# Patient Record
Sex: Female | Born: 1985
Health system: Southern US, Community
[De-identification: ages and names within clinical notes are randomized; demographics above are authoritative.]

## PROBLEM LIST (undated history)

## (undated) DIAGNOSIS — J45909 Unspecified asthma, uncomplicated: Secondary | ICD-10-CM

## (undated) DIAGNOSIS — F419 Anxiety disorder, unspecified: Secondary | ICD-10-CM

## (undated) DIAGNOSIS — N289 Disorder of kidney and ureter, unspecified: Secondary | ICD-10-CM

## (undated) DIAGNOSIS — F329 Major depressive disorder, single episode, unspecified: Secondary | ICD-10-CM

## (undated) DIAGNOSIS — T7840XA Allergy, unspecified, initial encounter: Secondary | ICD-10-CM

## (undated) DIAGNOSIS — Z87442 Personal history of urinary calculi: Secondary | ICD-10-CM

## (undated) DIAGNOSIS — F32A Depression, unspecified: Secondary | ICD-10-CM

## (undated) DIAGNOSIS — K529 Noninfective gastroenteritis and colitis, unspecified: Secondary | ICD-10-CM

## (undated) HISTORY — DX: Unspecified asthma, uncomplicated: J45.909

## (undated) HISTORY — DX: Noninfective gastroenteritis and colitis, unspecified: K52.9

## (undated) HISTORY — DX: Anxiety disorder, unspecified: F41.9

## (undated) HISTORY — DX: Allergy, unspecified, initial encounter: T78.40XA

## (undated) HISTORY — DX: Personal history of urinary calculi: Z87.442

## (undated) HISTORY — DX: Depression, unspecified: F32.A

## (undated) HISTORY — DX: Major depressive disorder, single episode, unspecified: F32.9

---

## 2008-08-14 HISTORY — PX: COLONOSCOPY: SHX174

## 2014-02-11 ENCOUNTER — Encounter: Payer: Self-pay | Admitting: Adult Health

## 2014-02-11 ENCOUNTER — Encounter (INDEPENDENT_AMBULATORY_CARE_PROVIDER_SITE_OTHER): Payer: Self-pay

## 2014-02-11 ENCOUNTER — Ambulatory Visit (INDEPENDENT_AMBULATORY_CARE_PROVIDER_SITE_OTHER): Payer: Federal, State, Local not specified - PPO | Admitting: Adult Health

## 2014-02-11 VITALS — BP 104/68 | HR 70 | Temp 98.8°F | Resp 14 | Ht 62.0 in | Wt 182.5 lb

## 2014-02-11 DIAGNOSIS — Z23 Encounter for immunization: Secondary | ICD-10-CM

## 2014-02-11 DIAGNOSIS — Z3009 Encounter for other general counseling and advice on contraception: Secondary | ICD-10-CM

## 2014-02-11 DIAGNOSIS — Z0000 Encounter for general adult medical examination without abnormal findings: Secondary | ICD-10-CM

## 2014-02-11 DIAGNOSIS — J4530 Mild persistent asthma, uncomplicated: Secondary | ICD-10-CM

## 2014-02-11 DIAGNOSIS — J45909 Unspecified asthma, uncomplicated: Secondary | ICD-10-CM

## 2014-02-11 MED ORDER — LEVONORGEST-ETH ESTRAD 91-DAY 0.15-0.03 &0.01 MG PO TABS: 1.0000 | ORAL_TABLET | Freq: Every day | ORAL | Status: DC

## 2014-02-11 MED ORDER — EPINEPHRINE 0.3 MG/0.3ML IJ SOAJ: 0.3000 mg | INTRAMUSCULAR | Status: DC | PRN

## 2014-02-11 MED ORDER — ALBUTEROL SULFATE HFA 108 (90 BASE) MCG/ACT IN AERS: 2.0000 | INHALATION_SPRAY | Freq: Four times a day (QID) | RESPIRATORY_TRACT | Status: AC | PRN

## 2014-02-11 NOTE — Progress Notes (Signed)
Pre visit review using our clinic review tool, if applicable. No additional management support is needed unless otherwise documented below in the visit note. 

## 2014-02-11 NOTE — Patient Instructions (Addendum)
  Start Seasonique birth control pills on the Sunday after your period. You will take 1 pill daily for 12 weeks straight. Then no pills on week #13. You may experience breakthrough bleeding for the first 2 cycles but this should subside by the 3 cycle.  Please return for your fasting blood work. You will need an appointment.  Medication refills sent to your pharmacy.  You received your tetanus vaccine today. This is good for 10 years.  Please call with any questions or concerns.

## 2014-02-11 NOTE — Progress Notes (Signed)
Subjective:    Patient ID: Brooke Walsh, female    DOB: Jun 19, 1986, 28 y.o.   MRN: [id number]  HPI  28 yo female presents today to establish care. [place]. Has seasonal allergies and asthma. Is needing refills of her albuterol and epi-pens. Uses the epi-pen for bee allergy.  Currently smokes 6 cigarettes/day. Previously attempted to quit smoking by taking Wellbutrin. Once stopped Wellbutrin, began smoking again.   Works for the [employer] and currently working on entering a nursing program.   Prevention:  PAP - 02/11/14 @ [facility] Tdap - needed Guardsil - completed Colonoscopy - 2011; Paternal grandmother had colon cancer  Past Medical History  Diagnosis Date  . Asthma   . Depression   . Allergy   . History of kidney stones    Family History  Problem Relation Age of Onset  . Alcohol abuse Mother   . Hyperlipidemia Mother   . Heart disease Mother   . Stroke Mother   . Hypertension Mother   . Hyperlipidemia Maternal Grandmother   . Heart disease Maternal Grandmother   . Hypertension Maternal Grandmother   . Diabetes Maternal Grandmother   . Alcohol abuse Maternal Grandfather   . Kidney disease Maternal Grandfather   . Cancer Paternal Grandmother     colon   Current outpatient prescriptions:Cyclobenzaprine HCl (FLEXERIL PO), Take 1 tablet by mouth as needed., Disp: , Rfl: ;  drospirenone-ethinyl estradiol (YAZ,GIANVI,LORYNA) 3-0.02 MG tablet, Take 1 tablet by mouth daily., Disp: , Rfl: ;  EPINEPHrine 0.3 mg/0.3 mL IJ SOAJ injection, Inject 0.3 mg into the muscle as needed., Disp: , Rfl: ;  UNABLE TO FIND, Inhaler, Disp: , Rfl:   Review of Systems  Constitutional: Negative.   HENT: Negative.   Eyes: Negative.   Respiratory: Negative.   Cardiovascular: Negative.   Gastrointestinal: Negative.   Endocrine: Negative.   Genitourinary: Negative.   Musculoskeletal: Negative.   Allergic/Immunologic: Positive for  environmental allergies.  Neurological: Negative.   Hematological: Negative.   Psychiatric/Behavioral: Negative.       Objective:  BP 104/68  Pulse 70  Temp(Src) 98.8 F (37.1 C) (Oral)  Resp 14  Ht 5\' 2"  (1.575 m)  Wt 182 lb 8 oz (82.781 kg)  BMI 33.37 kg/m2  SpO2 96%  LMP 01/28/2014   Physical Exam  Constitutional: She is oriented to person, place, and time. No distress.  HENT:  Head: Normocephalic.  Cardiovascular: Normal rate, regular rhythm and normal heart sounds.   Pulmonary/Chest: Effort normal and breath sounds normal.  Neurological: She is alert and oriented to person, place, and time.  Skin: Skin is warm and dry.  Psychiatric: She has a normal mood and affect. Her behavior is normal. Judgment and thought content normal.       Assessment & Plan:   1. Need for Tdap vaccination Does not recall last tetanus vaccination. Pt agreed to vaccinate today.  - Tdap vaccine greater than or equal to 7yo IM  2. Laboratory examination ordered as part of a complete physical examination Unknown last set of laboratory results. Pt is not fasting today. Will order labs for patient to obtain when fasting.  - TSH; Future - CBC with Differential; Future - Lipid panel; Future - Vit D  25 hydroxy (rtn osteoporosis monitoring); Future - Vitamin B12; Future - Basic metabolic panel; Future  3. Asthma, chronic, mild persistent, uncomplicated Self-reported asthma. Indicates she uses her albuterol inhaler at least once per day.  Consider adding steroid inhaler if remains uncontrolled.   - albuterol (PROVENTIL HFA;VENTOLIN HFA) 108 (90 BASE) MCG/ACT inhaler; Inhale 2 puffs into the lungs every 6 (six) hours as needed for wheezing or shortness of breath.  Dispense: 1 Inhaler; Refill: 11  4. Birth control counseling Discussed birth control options. Pt in agreement with trying longer cycles. Start Seasonique birth control pills on the Sunday after your period. Instructed to take 1 pill  daily for 12 weeks straight. Then no pills on week #13. If experience breakthrough bleeding for the first 2 cycles but this should subside by the 3 cycle.  - Levonorgestrel-Ethinyl Estradiol (AMETHIA,CAMRESE) 0.15-0.03 &0.01 MG tablet; Take 1 tablet by mouth daily.  Dispense: 1 Package; Refill: 4

## 2014-02-16 ENCOUNTER — Other Ambulatory Visit (INDEPENDENT_AMBULATORY_CARE_PROVIDER_SITE_OTHER): Payer: Federal, State, Local not specified - PPO

## 2014-02-16 DIAGNOSIS — Z0000 Encounter for general adult medical examination without abnormal findings: Secondary | ICD-10-CM

## 2014-02-16 LAB — VITAMIN B12: VITAMIN B 12: 419 pg/mL (ref 211–911)

## 2014-02-16 LAB — CBC WITH DIFFERENTIAL/PLATELET
BASOS PCT: 0.6 % (ref 0.0–3.0)
Basophils Absolute: 0.1 10*3/uL (ref 0.0–0.1)
Eosinophils Absolute: 0.1 10*3/uL (ref 0.0–0.7)
Eosinophils Relative: 1.1 % (ref 0.0–5.0)
HEMATOCRIT: 41.4 % (ref 36.0–46.0)
HEMOGLOBIN: 13.9 g/dL (ref 12.0–15.0)
LYMPHS ABS: 3.2 10*3/uL (ref 0.7–4.0)
Lymphocytes Relative: 26.9 % (ref 12.0–46.0)
MCHC: 33.5 g/dL (ref 30.0–36.0)
MCV: 93.9 fl (ref 78.0–100.0)
MONO ABS: 0.5 10*3/uL (ref 0.1–1.0)
MONOS PCT: 4.6 % (ref 3.0–12.0)
NEUTROS ABS: 7.9 10*3/uL — AB (ref 1.4–7.7)
Neutrophils Relative %: 66.8 % (ref 43.0–77.0)
PLATELETS: 318 10*3/uL (ref 150.0–400.0)
RBC: 4.41 Mil/uL (ref 3.87–5.11)
RDW: 14.4 % (ref 11.5–15.5)
WBC: 11.9 10*3/uL — AB (ref 4.0–10.5)

## 2014-02-16 LAB — LIPID PANEL
CHOL/HDL RATIO: 3
Cholesterol: 179 mg/dL (ref 0–200)
HDL: 59.1 mg/dL (ref >39.00)
LDL Cholesterol: 97 mg/dL (ref 0–99)
NONHDL: 119.9
Triglycerides: 114 mg/dL (ref 0.0–149.0)
VLDL: 22.8 mg/dL (ref 0.0–40.0)

## 2014-02-16 LAB — BASIC METABOLIC PANEL
BUN: 12 mg/dL (ref 6–23)
CALCIUM: 9.2 mg/dL (ref 8.4–10.5)
CO2: 21 mEq/L (ref 19–32)
CREATININE: 0.9 mg/dL (ref 0.4–1.2)
Chloride: 109 mEq/L (ref 96–112)
GFR: 94.94 mL/min (ref >60.00)
Glucose, Bld: 95 mg/dL (ref 70–99)
Potassium: 4 mEq/L (ref 3.5–5.1)
Sodium: 139 mEq/L (ref 135–145)

## 2014-02-16 LAB — TSH: TSH: 1.23 u[IU]/mL (ref 0.35–4.50)

## 2014-02-16 LAB — VITAMIN D 25 HYDROXY (VIT D DEFICIENCY, FRACTURES): VITD: 11.2 ng/mL

## 2014-06-19 ENCOUNTER — Telehealth: Payer: Self-pay | Admitting: Adult Health

## 2014-06-19 NOTE — Telephone Encounter (Signed)
Spoke with pt, transferred to scheduling for appoint at [facility] next week

## 2014-06-19 NOTE — Telephone Encounter (Signed)
[name] called in regards to having a re-occuring cyst to come and go in her lower pelvic region. She said it's not in her pubic area. There's no pain or discharge but it comes and goes and this has been going on for years. She also mentioned it's the size of two quarters. The pt is wondering if she can be seen soon in regards to this. Please call the patient. Pt ph# [phone] Thank you.

## 2014-06-22 ENCOUNTER — Ambulatory Visit (INDEPENDENT_AMBULATORY_CARE_PROVIDER_SITE_OTHER): Payer: Federal, State, Local not specified - PPO | Admitting: Internal Medicine

## 2014-06-22 ENCOUNTER — Encounter: Payer: Self-pay | Admitting: Internal Medicine

## 2014-06-22 VITALS — BP 120/78 | HR 99 | Temp 98.1°F | Wt 175.5 lb

## 2014-06-22 DIAGNOSIS — N764 Abscess of vulva: Secondary | ICD-10-CM

## 2014-06-22 DIAGNOSIS — F489 Nonpsychotic mental disorder, unspecified: Secondary | ICD-10-CM

## 2014-06-22 DIAGNOSIS — F4323 Adjustment disorder with mixed anxiety and depressed mood: Secondary | ICD-10-CM

## 2014-06-22 DIAGNOSIS — Z7289 Other problems related to lifestyle: Secondary | ICD-10-CM

## 2014-06-22 MED ORDER — DOXYCYCLINE HYCLATE 100 MG PO TABS: 100.0000 mg | ORAL_TABLET | Freq: Two times a day (BID) | ORAL | Status: DC

## 2014-06-22 MED ORDER — BUSPIRONE HCL 10 MG PO TABS: 10.0000 mg | ORAL_TABLET | Freq: Two times a day (BID) | ORAL | Status: DC

## 2014-06-22 NOTE — Progress Notes (Signed)
Pre visit review using our clinic review tool, if applicable. No additional management support is needed unless otherwise documented below in the visit note. 
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
## 2014-06-22 NOTE — Patient Instructions (Signed)
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
## 2014-06-23 ENCOUNTER — Telehealth: Payer: Self-pay | Admitting: Adult Health

## 2014-06-23 NOTE — Telephone Encounter (Signed)
emmi emailed °

## 2014-07-01 ENCOUNTER — Other Ambulatory Visit: Payer: Self-pay | Admitting: *Deleted

## 2014-07-01 ENCOUNTER — Telehealth: Payer: Self-pay | Admitting: *Deleted

## 2014-07-01 MED ORDER — BUDESONIDE-FORMOTEROL FUMARATE 160-4.5 MCG/ACT IN AERO: 2.0000 | INHALATION_SPRAY | Freq: Two times a day (BID) | RESPIRATORY_TRACT | Status: DC

## 2014-07-01 NOTE — Telephone Encounter (Signed)
Please add to med list.

## 2014-07-01 NOTE — Telephone Encounter (Addendum)
Pt called states she was told to call back with the name of the inhaler which is Symbicort.  Pt was seen by you on 11.9.15. Medication not on pts current med list.  Please advise  Medication added, is it ok to refil?

## 2014-07-01 NOTE — Telephone Encounter (Signed)
Yes, ok to refill 

## 2014-07-02 ENCOUNTER — Encounter: Payer: Self-pay | Admitting: Internal Medicine

## 2014-07-02 ENCOUNTER — Ambulatory Visit (INDEPENDENT_AMBULATORY_CARE_PROVIDER_SITE_OTHER): Payer: Federal, State, Local not specified - PPO | Admitting: Internal Medicine

## 2014-07-02 VITALS — BP 108/66 | HR 92 | Temp 98.6°F | Wt 172.0 lb

## 2014-07-02 DIAGNOSIS — J069 Acute upper respiratory infection, unspecified: Secondary | ICD-10-CM

## 2014-07-02 DIAGNOSIS — B9789 Other viral agents as the cause of diseases classified elsewhere: Principal | ICD-10-CM

## 2014-07-02 MED ORDER — HYDROCODONE-HOMATROPINE 5-1.5 MG/5ML PO SYRP: 5.0000 mL | ORAL_SOLUTION | Freq: Three times a day (TID) | ORAL | Status: DC | PRN
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
## 2014-07-02 NOTE — Patient Instructions (Signed)
Cough, Adult  A cough is a reflex that helps clear your throat and airways. It can help heal the body or may be a reaction to an irritated airway. A cough may only last 2 or 3 weeks (acute) or may last more than 8 weeks (chronic).  CAUSES Acute cough:  Viral or bacterial infections. Chronic cough:  Infections.  Allergies.  Asthma.  Post-nasal drip.  Smoking.  Heartburn or acid reflux.  Some medicines.  Chronic lung problems (COPD).  Cancer. SYMPTOMS   Cough.  Fever.  Chest pain.  Increased breathing rate.  High-pitched whistling sound when breathing (wheezing).  Colored mucus that you cough up (sputum). TREATMENT   A bacterial cough may be treated with antibiotic medicine.  A viral cough must run its course and will not respond to antibiotics.  Your caregiver may recommend other treatments if you have a chronic cough. HOME CARE INSTRUCTIONS   Only take over-the-counter or prescription medicines for pain, discomfort, or fever as directed by your caregiver. Use cough suppressants only as directed by your caregiver.  Use a cold steam vaporizer or humidifier in your bedroom or home to help loosen secretions.  Sleep in a semi-upright position if your cough is worse at night.  Rest as needed.  Stop smoking if you smoke. SEEK IMMEDIATE MEDICAL CARE IF:   You have pus in your sputum.  Your cough starts to worsen.  You cannot control your cough with suppressants and are losing sleep.  You begin coughing up blood.  You have difficulty breathing.  You develop pain which is getting worse or is uncontrolled with medicine.  You have a fever. MAKE SURE YOU:   Understand these instructions.  Will watch your condition.  Will get help right away if you are not doing well or get worse. Document Released: 01/27/2011 Document Revised: 10/23/2011 Document Reviewed: 01/27/2011 ExitCare Patient Information 2015 [employer]. This information is not intended  to replace advice given to you by your health care provider. Make sure you discuss any questions you have with your health care provider.  

## 2014-07-02 NOTE — Progress Notes (Signed)
Pre visit review using our clinic review tool, if applicable. No additional management support is needed unless otherwise documented below in the visit note. 

## 2014-07-03 ENCOUNTER — Encounter: Payer: Self-pay | Admitting: Internal Medicine

## 2014-07-07 ENCOUNTER — Other Ambulatory Visit: Payer: Self-pay | Admitting: Internal Medicine

## 2014-07-07 ENCOUNTER — Telehealth: Payer: Self-pay | Admitting: Internal Medicine

## 2014-07-07 MED ORDER — FLUCONAZOLE 150 MG PO TABS: 150.0000 mg | ORAL_TABLET | Freq: Once | ORAL | Status: DC

## 2014-07-07 MED ORDER — AZITHROMYCIN 250 MG PO TABS: ORAL_TABLET | ORAL | Status: DC

## 2014-07-07 NOTE — Telephone Encounter (Signed)
Called in azithromycin for URI symptoms Doxycycline side effects include nausea, GI upset and rash

## 2014-07-07 NOTE — Telephone Encounter (Signed)
Pt called stating she is not feeling any better. Pt request abx called in to [facility] [address]  Pt also request call back regarding side effect of previous abx. Please advise

## 2014-07-07 NOTE — Telephone Encounter (Signed)
Pt called back. States that the previous antibiotic gave her a yeast infection. Can something be call in for yeast infection. Please advise

## 2014-07-07 NOTE — Telephone Encounter (Signed)
Left detailed msg on VM per HIPAA  

## 2014-07-07 NOTE — Telephone Encounter (Signed)
Diflucan called in.

## 2014-07-20 ENCOUNTER — Encounter: Payer: Federal, State, Local not specified - PPO | Admitting: Internal Medicine

## 2014-07-20 ENCOUNTER — Encounter: Payer: Self-pay | Admitting: Internal Medicine

## 2014-07-20 NOTE — Progress Notes (Signed)
Pre visit review using our clinic review tool, if applicable. No additional management support is needed unless otherwise documented below in the visit note. 

## 2014-07-21 NOTE — Progress Notes (Signed)
error 

## 2014-07-23 ENCOUNTER — Encounter (INDEPENDENT_AMBULATORY_CARE_PROVIDER_SITE_OTHER): Payer: Self-pay

## 2014-07-23 ENCOUNTER — Encounter (HOSPITAL_COMMUNITY): Payer: Self-pay | Admitting: Psychiatry

## 2014-07-23 ENCOUNTER — Ambulatory Visit (INDEPENDENT_AMBULATORY_CARE_PROVIDER_SITE_OTHER): Payer: Federal, State, Local not specified - PPO | Admitting: Psychiatry

## 2014-07-23 VITALS — BP 127/78 | HR 60 | Ht 62.0 in | Wt 175.0 lb

## 2014-07-23 DIAGNOSIS — Z7289 Other problems related to lifestyle: Secondary | ICD-10-CM

## 2014-07-23 DIAGNOSIS — F411 Generalized anxiety disorder: Secondary | ICD-10-CM | POA: Insufficient documentation

## 2014-07-23 DIAGNOSIS — F3181 Bipolar II disorder: Secondary | ICD-10-CM

## 2014-07-23 DIAGNOSIS — F17210 Nicotine dependence, cigarettes, uncomplicated: Secondary | ICD-10-CM

## 2014-07-23 DIAGNOSIS — F1099 Alcohol use, unspecified with unspecified alcohol-induced disorder: Secondary | ICD-10-CM

## 2014-07-23 DIAGNOSIS — F4323 Adjustment disorder with mixed anxiety and depressed mood: Secondary | ICD-10-CM

## 2014-07-23 DIAGNOSIS — F1210 Cannabis abuse, uncomplicated: Secondary | ICD-10-CM

## 2014-07-23 DIAGNOSIS — F410 Panic disorder [episodic paroxysmal anxiety] without agoraphobia: Secondary | ICD-10-CM

## 2014-07-23 DIAGNOSIS — F1020 Alcohol dependence, uncomplicated: Secondary | ICD-10-CM

## 2014-07-23 MED ORDER — LITHIUM CARBONATE ER 300 MG PO TBCR: 300.0000 mg | EXTENDED_RELEASE_TABLET | Freq: Every day | ORAL | Status: DC

## 2014-07-23 MED ORDER — BUSPIRONE HCL 10 MG PO TABS: ORAL_TABLET | ORAL | Status: DC
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
## 2014-07-29 ENCOUNTER — Ambulatory Visit (INDEPENDENT_AMBULATORY_CARE_PROVIDER_SITE_OTHER): Payer: Federal, State, Local not specified - PPO | Admitting: Clinical

## 2014-07-29 ENCOUNTER — Encounter (HOSPITAL_COMMUNITY): Payer: Self-pay | Admitting: Clinical

## 2014-07-29 DIAGNOSIS — F331 Major depressive disorder, recurrent, moderate: Secondary | ICD-10-CM

## 2014-07-29 DIAGNOSIS — F411 Generalized anxiety disorder: Secondary | ICD-10-CM

## 2014-07-29 DIAGNOSIS — F1994 Other psychoactive substance use, unspecified with psychoactive substance-induced mood disorder: Secondary | ICD-10-CM

## 2014-07-29 DIAGNOSIS — F10220 Alcohol dependence with intoxication, uncomplicated: Secondary | ICD-10-CM
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
## 2014-07-31 ENCOUNTER — Telehealth (HOSPITAL_COMMUNITY): Payer: Self-pay | Admitting: *Deleted

## 2014-07-31 NOTE — Telephone Encounter (Signed)
Pt called stating she had a medication question, no further information given. Returned call, no answer.

## 2014-08-11 ENCOUNTER — Telehealth (HOSPITAL_COMMUNITY): Payer: Self-pay

## 2014-08-11 NOTE — Telephone Encounter (Signed)
Called and left voice message for call back 

## 2014-08-13 ENCOUNTER — Ambulatory Visit (HOSPITAL_COMMUNITY): Payer: Self-pay | Admitting: Clinical

## 2014-08-13 ENCOUNTER — Telehealth (HOSPITAL_COMMUNITY): Payer: Self-pay | Admitting: Psychiatry

## 2014-08-13 DIAGNOSIS — F3181 Bipolar II disorder: Secondary | ICD-10-CM

## 2014-08-13 MED ORDER — LAMOTRIGINE 25 MG PO TABS: ORAL_TABLET | ORAL | Status: DC

## 2014-08-13 NOTE — Telephone Encounter (Signed)
Called and left voice message for pt to call back.

## 2014-08-13 NOTE — Telephone Encounter (Signed)
Pt reports she has diarrhea every time she took Lithium and stopped taking it after a few days. She also stopped Buspar. Mood is horrible.  A/P: Bipolar II- currently depressed 1. Restart Buspar  2. Start trial of Lamictal 25mg  po qD x 14 days then increase to 50mg  po qD

## 2014-08-26 ENCOUNTER — Telehealth (HOSPITAL_COMMUNITY): Payer: Self-pay | Admitting: Clinical

## 2014-08-27 ENCOUNTER — Ambulatory Visit (HOSPITAL_COMMUNITY): Payer: Self-pay | Admitting: Clinical

## 2014-09-04 ENCOUNTER — Ambulatory Visit (HOSPITAL_COMMUNITY): Payer: Self-pay | Admitting: Clinical

## 2014-09-10 NOTE — Telephone Encounter (Signed)
Made call, left message

## 2014-09-15 ENCOUNTER — Encounter: Payer: Self-pay | Admitting: Adult Health

## 2014-09-18 ENCOUNTER — Ambulatory Visit (HOSPITAL_COMMUNITY): Payer: Self-pay | Admitting: Clinical

## 2014-09-29 ENCOUNTER — Ambulatory Visit (HOSPITAL_COMMUNITY): Payer: Federal, State, Local not specified - PPO | Admitting: Psychiatry

## 2014-10-05 ENCOUNTER — Telehealth (HOSPITAL_COMMUNITY): Payer: Self-pay

## 2014-10-05 DIAGNOSIS — F410 Panic disorder [episodic paroxysmal anxiety] without agoraphobia: Secondary | ICD-10-CM

## 2014-10-05 DIAGNOSIS — F3181 Bipolar II disorder: Secondary | ICD-10-CM

## 2014-10-05 NOTE — Telephone Encounter (Signed)
Telephone call with patient reporting she missed her appointment on 09/29/14 and had rescheduled for 10/15/14 with Dr. Michae KavaAgarwal.  Patient requests one time refill of Lamictal and BuSpar until seen.

## 2014-10-06 ENCOUNTER — Ambulatory Visit (HOSPITAL_COMMUNITY): Payer: Self-pay | Admitting: Clinical

## 2014-10-06 NOTE — Telephone Encounter (Signed)
One refill is fine. Must come to follow up to get any further refills after this

## 2014-10-07 MED ORDER — LAMOTRIGINE 25 MG PO TABS: ORAL_TABLET | ORAL | Status: DC

## 2014-10-07 MED ORDER — BUSPIRONE HCL 10 MG PO TABS: ORAL_TABLET | ORAL | Status: DC

## 2014-10-07 NOTE — Telephone Encounter (Signed)
Medication refills of patient's Lamictal and BuSpar authorized one time per Dr. Michae KavaAgarwal as was previously ordered.  Attempted to reach patient by phone to assess if she was now at 50mg  of Lamictal per day but was unable to reach patient so continued order as was previously written.  [facility] orders [facility]'s [facility].  Unable to reach patient by phone to inform order was sent in today.  Patient with new appointment set for 10/15/14.

## 2014-10-15 ENCOUNTER — Encounter (HOSPITAL_COMMUNITY): Payer: Self-pay | Admitting: Psychiatry

## 2014-10-15 ENCOUNTER — Ambulatory Visit (INDEPENDENT_AMBULATORY_CARE_PROVIDER_SITE_OTHER): Payer: Federal, State, Local not specified - PPO | Admitting: Psychiatry

## 2014-10-15 VITALS — BP 132/89 | HR 80 | Ht 62.0 in | Wt 182.0 lb

## 2014-10-15 DIAGNOSIS — F411 Generalized anxiety disorder: Secondary | ICD-10-CM

## 2014-10-15 DIAGNOSIS — F410 Panic disorder [episodic paroxysmal anxiety] without agoraphobia: Secondary | ICD-10-CM | POA: Diagnosis not present

## 2014-10-15 DIAGNOSIS — F1099 Alcohol use, unspecified with unspecified alcohol-induced disorder: Secondary | ICD-10-CM | POA: Diagnosis not present

## 2014-10-15 DIAGNOSIS — F1290 Cannabis use, unspecified, uncomplicated: Secondary | ICD-10-CM

## 2014-10-15 DIAGNOSIS — F3181 Bipolar II disorder: Secondary | ICD-10-CM | POA: Diagnosis not present

## 2014-10-15 DIAGNOSIS — F17200 Nicotine dependence, unspecified, uncomplicated: Secondary | ICD-10-CM

## 2014-10-15 MED ORDER — CITALOPRAM HYDROBROMIDE 20 MG PO TABS: 20.0000 mg | ORAL_TABLET | Freq: Every day | ORAL | Status: DC

## 2014-10-15 MED ORDER — LAMOTRIGINE 25 MG PO TABS: ORAL_TABLET | ORAL | Status: DC
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
## 2014-10-19 ENCOUNTER — Encounter: Payer: Self-pay | Admitting: *Deleted

## 2014-10-19 ENCOUNTER — Ambulatory Visit (INDEPENDENT_AMBULATORY_CARE_PROVIDER_SITE_OTHER): Payer: Federal, State, Local not specified - PPO | Admitting: Internal Medicine

## 2014-10-19 ENCOUNTER — Encounter: Payer: Self-pay | Admitting: Internal Medicine

## 2014-10-19 VITALS — BP 128/66 | HR 62 | Temp 98.2°F | Wt 185.8 lb

## 2014-10-19 DIAGNOSIS — J069 Acute upper respiratory infection, unspecified: Secondary | ICD-10-CM

## 2014-10-19 DIAGNOSIS — B9789 Other viral agents as the cause of diseases classified elsewhere: Principal | ICD-10-CM

## 2014-10-19 LAB — POCT INFLUENZA A/B
Influenza A, POC: NEGATIVE
Influenza B, POC: NEGATIVE

## 2014-10-19 MED ORDER — HYDROCODONE-HOMATROPINE 5-1.5 MG/5ML PO SYRP: 5.0000 mL | ORAL_SOLUTION | Freq: Three times a day (TID) | ORAL | Status: DC | PRN
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
## 2014-10-19 NOTE — Progress Notes (Signed)
Pre visit review using our clinic review tool, if applicable. No additional management support is needed unless otherwise documented below in the visit note. 
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
## 2014-10-19 NOTE — Addendum Note (Signed)
Addended by: Desmond DikeKNIGHT, Orenthal Debski H on: 10/19/2014 01:13 PM   Modules accepted: Orders

## 2014-10-19 NOTE — Patient Instructions (Signed)
Cough, Adult  A cough is a reflex that helps clear your throat and airways. It can help heal the body or may be a reaction to an irritated airway. A cough may only last 2 or 3 weeks (acute) or may last more than 8 weeks (chronic).  CAUSES Acute cough:  Viral or bacterial infections. Chronic cough:  Infections.  Allergies.  Asthma.  Post-nasal drip.  Smoking.  Heartburn or acid reflux.  Some medicines.  Chronic lung problems (COPD).  Cancer. SYMPTOMS   Cough.  Fever.  Chest pain.  Increased breathing rate.  High-pitched whistling sound when breathing (wheezing).  Colored mucus that you cough up (sputum). TREATMENT   A bacterial cough may be treated with antibiotic medicine.  A viral cough must run its course and will not respond to antibiotics.  Your caregiver may recommend other treatments if you have a chronic cough. HOME CARE INSTRUCTIONS   Only take over-the-counter or prescription medicines for pain, discomfort, or fever as directed by your caregiver. Use cough suppressants only as directed by your caregiver.  Use a cold steam vaporizer or humidifier in your bedroom or home to help loosen secretions.  Sleep in a semi-upright position if your cough is worse at night.  Rest as needed.  Stop smoking if you smoke. SEEK IMMEDIATE MEDICAL CARE IF:   You have pus in your sputum.  Your cough starts to worsen.  You cannot control your cough with suppressants and are losing sleep.  You begin coughing up blood.  You have difficulty breathing.  You develop pain which is getting worse or is uncontrolled with medicine.  You have a fever. MAKE SURE YOU:   Understand these instructions.  Will watch your condition.  Will get help right away if you are not doing well or get worse. Document Released: 01/27/2011 Document Revised: 10/23/2011 Document Reviewed: 01/27/2011 ExitCare Patient Information 2015 [employer]. This information is not intended  to replace advice given to you by your health care provider. Make sure you discuss any questions you have with your health care provider.  

## 2014-10-20 ENCOUNTER — Ambulatory Visit (INDEPENDENT_AMBULATORY_CARE_PROVIDER_SITE_OTHER): Payer: Federal, State, Local not specified - PPO | Admitting: Clinical

## 2014-10-20 ENCOUNTER — Encounter (HOSPITAL_COMMUNITY): Payer: Self-pay | Admitting: Clinical

## 2014-10-20 ENCOUNTER — Encounter (HOSPITAL_COMMUNITY): Payer: Self-pay

## 2014-10-20 DIAGNOSIS — F1020 Alcohol dependence, uncomplicated: Secondary | ICD-10-CM | POA: Diagnosis not present

## 2014-10-20 DIAGNOSIS — F331 Major depressive disorder, recurrent, moderate: Secondary | ICD-10-CM

## 2014-10-20 DIAGNOSIS — F411 Generalized anxiety disorder: Secondary | ICD-10-CM

## 2014-10-20 NOTE — Progress Notes (Signed)
   THERAPIST PROGRESS NOTE  Session Time: 3:30-4:30  Participation Level: Active  Behavioral Response: CasualAlertDepressed  Type of Therapy: Individual Therapy  Treatment Goals addressed: improve psychiatric symptoms, emotional regulation, harm reduction  Interventions: Motivational Interviewing  Summary: Yzabelle Calles is a 29 y.o. female who presents with Alcohol Use Disorder, Major Depressive Disorder, recurrent moderate, Generalized Anxiety Disorder.   Suicidal/Homicidal: Nowithout intent/plan  Therapist Response:  Caryl Pina met with clinician for an individual session. Client and clinician updated her assessment since she had not returned for an appointment since the initial assessment 07/29/14. Sharian shared that things were much the same except her drinking had increased and she was now drinking Vodka and had dropped the wine. Client and clinician discussed how drinking was affecting her mental health and her options to quit drinking if she so desired. Smriti shared that she was not ready to quit but would like to begin to cut back. Client and clinician discussed ways she could cut back. Clinician let Rushie know that if she at any point wanted a higher level of care then she would assist Lizzette in finding what she needed. Caryl Pina and clinician discussed grounding techniques as a way of interrupting negative thought patterns or detaching from emotional pain. Client and clinician discussed the process and purpose. Client and clinician practiced some of the techniques together.  Lucindia shared that she thought her mental health meds were making her more aggressive feeling. Client shared that she plans to discuss it with her Psychiatrist.  Clinician encouraged her to be honest about her symptoms as well as her current alcohol intake.  Diamone agreed to continue practicing grounding techniques as well as to try the techniques she identified to cut down on her alcohol intake.   Plan: Return again  in 1 week.  Diagnosis: Axis I: Alcohol Use Disorder, moderate, Dependence, Major Depressive Disorder, recurrent moderate, Generalized Anxiety Disorder       Breslyn Abdo A, LCSW 10/20/2014
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
## 2014-10-27 ENCOUNTER — Encounter (HOSPITAL_COMMUNITY): Payer: Self-pay | Admitting: Clinical

## 2014-10-27 ENCOUNTER — Ambulatory Visit (INDEPENDENT_AMBULATORY_CARE_PROVIDER_SITE_OTHER): Payer: Federal, State, Local not specified - PPO | Admitting: Clinical

## 2014-10-27 DIAGNOSIS — F331 Major depressive disorder, recurrent, moderate: Secondary | ICD-10-CM

## 2014-10-27 DIAGNOSIS — F1020 Alcohol dependence, uncomplicated: Secondary | ICD-10-CM

## 2014-10-27 DIAGNOSIS — F411 Generalized anxiety disorder: Secondary | ICD-10-CM | POA: Diagnosis not present

## 2014-10-27 NOTE — Progress Notes (Signed)
   THERAPIST PROGRESS NOTE  Session Time: 2:48 -3:35  Participation Level: Active  Behavioral Response: Casual and NeatAlertAnxious  Type of Therapy: Individual Therapy  Treatment Goals addressed: improve psychiatric symptoms, emotional regulation skills, harm reduction skills, and improve faulty thinking patterns  Interventions: CBT, Motivational Interviewing and Other: Grounding and mindfulness skills  Summary: Clotilda Hafer is a 29 y.o. female who presents with Alcohol Use Disorder, Generalized Anxiety Disorder, and Major Depressive Disorder moderate   Suicidal/Homicidal: No -without intent/plan  Therapist Response:  Caryl Pina met with clinician for an individual session. Shakya discussed her psychiatric symptoms, her current life events, and her homework. Lamyia shared that she was feeling depressed this past week. She shared that she had used a lot of grounding techniques because she had experienced a lot of emotional pain. Client and clinician discussed how [place] could improve her practice. She shared that she found herself replaying events from the past. Client and clinician discussed her thoughts.and emotions. Client and clinician discussed her alcohol intake. Sarah shared that she was unable to cut back and is contenplating quitting alcohol completely. Caryl Pina and clinician discussed detox, inpatient treatment,SA IOP and AA. Benedicta said she would like to look at her work policy to determine what would be possible with out loosing her job. Client and clinician discussed one event that kept showing up in Connee's thoughts. Client and clinician discussed the process of thought stopping and challenging unhelpful thoughts. Elly agreed to complete some cbt and grounding homework as well as to consider her options for a higher level of care to assist her in quitting drinking.  Plan: Return again in 1 weeks.  Diagnosis: Axis I: Alcohol Use Disorder, Generalized Anxiety Disorder, Major  Depressive Disorder moderate   Lalaine Overstreet A, LCSW 10/27/2014

## 2014-11-12 ENCOUNTER — Ambulatory Visit (HOSPITAL_COMMUNITY): Payer: Self-pay | Admitting: Clinical

## 2014-11-26 ENCOUNTER — Ambulatory Visit (INDEPENDENT_AMBULATORY_CARE_PROVIDER_SITE_OTHER): Payer: Federal, State, Local not specified - PPO | Admitting: Clinical

## 2014-11-26 DIAGNOSIS — F331 Major depressive disorder, recurrent, moderate: Secondary | ICD-10-CM | POA: Diagnosis not present

## 2014-11-26 DIAGNOSIS — F1020 Alcohol dependence, uncomplicated: Secondary | ICD-10-CM | POA: Diagnosis not present

## 2014-11-26 DIAGNOSIS — F411 Generalized anxiety disorder: Secondary | ICD-10-CM | POA: Diagnosis not present

## 2014-11-26 NOTE — Progress Notes (Signed)
   THERAPIST PROGRESS NOTE  Session Time: 3:30 - 4:27  Participation Level: Active  Behavioral Response: Casual and NeatAlertDepressed  Type of Therapy: Individual Therapy  Treatment Goals addressed: improve psychiatric symptoms, emotional regulation skills, harm reduction skills,   Interventions: CBT, Motivational Interviewing   Summary: Brooke Walsh is a 29 y.o. female who presents with Alcohol Use Disorder, Generalized Anxiety Disorder, and Major Depressive Disorder moderate   Suicidal/Homicidal: No -without intent/plan  Therapist Response:  Brooke Walsh met with clinician for an individual session. Brooke Walsh discussed her psychiatric symptoms, her current life events, and her homework. Letesha shared that she had been cutting down on her alcohol intake but had increased it again. Brooke Walsh and clinician discussed different treatment options as well as 12 step meetings. Brooke Walsh and clinician discussed detox. Client and clinician discussed the importance of not driving while under the influence. Client and clinician discussed the importance of clearing all alcohol out of her house so that it would not be there on her return. Betul shared that she recognized that her drinking is a problem and would like to stop before it caused her trouble. Brooke Walsh and clinician discussed how her other mental health issues are impacted by her alcohol use. Brooke Walsh and clinician discussed emotions and how to improve them.   Plan: Return again in 1 weeks.  Diagnosis: Axis I: Alcohol Use Disorder, Generalized Anxiety Disorder, Major Depressive Disorder moderate    Gemma Payor 11/26/2014

## 2014-11-30 ENCOUNTER — Encounter (HOSPITAL_COMMUNITY): Payer: Self-pay | Admitting: Clinical

## 2014-12-15 ENCOUNTER — Ambulatory Visit (HOSPITAL_COMMUNITY): Payer: Self-pay | Admitting: Psychiatry

## 2014-12-15 ENCOUNTER — Ambulatory Visit (HOSPITAL_COMMUNITY): Payer: Self-pay | Admitting: Clinical

## 2014-12-17 ENCOUNTER — Ambulatory Visit (HOSPITAL_COMMUNITY): Payer: Self-pay | Admitting: Psychiatry

## 2014-12-22 ENCOUNTER — Encounter (HOSPITAL_COMMUNITY): Payer: Self-pay | Admitting: Psychiatry

## 2014-12-22 ENCOUNTER — Ambulatory Visit (INDEPENDENT_AMBULATORY_CARE_PROVIDER_SITE_OTHER): Payer: Federal, State, Local not specified - PPO | Admitting: Psychiatry

## 2014-12-22 VITALS — BP 133/80 | HR 105 | Ht 62.5 in | Wt 186.2 lb

## 2014-12-22 DIAGNOSIS — F3130 Bipolar disorder, current episode depressed, mild or moderate severity, unspecified: Secondary | ICD-10-CM | POA: Diagnosis not present

## 2014-12-22 DIAGNOSIS — F1010 Alcohol abuse, uncomplicated: Secondary | ICD-10-CM

## 2014-12-22 DIAGNOSIS — F410 Panic disorder [episodic paroxysmal anxiety] without agoraphobia: Secondary | ICD-10-CM | POA: Diagnosis not present

## 2014-12-22 DIAGNOSIS — F3181 Bipolar II disorder: Secondary | ICD-10-CM

## 2014-12-22 DIAGNOSIS — F411 Generalized anxiety disorder: Secondary | ICD-10-CM | POA: Diagnosis not present

## 2014-12-22 DIAGNOSIS — F1210 Cannabis abuse, uncomplicated: Secondary | ICD-10-CM

## 2014-12-22 MED ORDER — ALPRAZOLAM 0.5 MG PO TABS: 0.5000 mg | ORAL_TABLET | Freq: Every evening | ORAL | Status: DC | PRN

## 2014-12-22 MED ORDER — BUPROPION HCL ER (XL) 150 MG PO TB24: 150.0000 mg | ORAL_TABLET | Freq: Every day | ORAL | Status: DC

## 2014-12-22 MED ORDER — LAMOTRIGINE 25 MG PO TABS: ORAL_TABLET | ORAL | Status: DC
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
## 2014-12-24 ENCOUNTER — Ambulatory Visit (HOSPITAL_COMMUNITY): Payer: Self-pay | Admitting: Clinical

## 2015-05-06 ENCOUNTER — Telehealth (HOSPITAL_COMMUNITY): Payer: Self-pay

## 2015-05-06 DIAGNOSIS — F410 Panic disorder [episodic paroxysmal anxiety] without agoraphobia: Secondary | ICD-10-CM

## 2015-05-06 DIAGNOSIS — F411 Generalized anxiety disorder: Secondary | ICD-10-CM

## 2015-05-06 NOTE — Telephone Encounter (Signed)
Telephone call from patient requesting a refill of her prescribed Alprazolam, last written 12/22/14 with no refills and patient returns on 06/24/15.  Patient would like medication refill called into her [facility] in [place].

## 2015-05-07 MED ORDER — ALPRAZOLAM 0.5 MG PO TABS: 0.5000 mg | ORAL_TABLET | Freq: Every evening | ORAL | Status: DC | PRN

## 2015-05-07 NOTE — Telephone Encounter (Signed)
Met with Dr. Salem Senate due to Dr. Doyne Keel being out of the office this date to question if she would approve a refill of patient's prescribed Alprazolam as patient last had a one time order from 12/22/14 and returns to see Dr. Doyne Keel on 06/24/15.  Informed patient was given a 6 month next appointment and Dr. Salem Senate approved a one time order to last patient until returns to see Dr. Doyne Keel on 06/24/15.  Called in one time refill of Alprazolam 0.5 mg tablet, one at bedtime, #30 with no refills for anxiety or sleep to [facility] in [place] with Ronalee Belts, pharmacist.  Called patient and left a message to inform a one time refill of her requested Alprazolam was called into her [facility] in [place] and reminded patient on message she would have to keep upcoming appointment with Dr. Doyne Keel on 06/24/15 for any further refills of Alprazolam.

## 2015-05-10 ENCOUNTER — Other Ambulatory Visit: Payer: Self-pay

## 2015-05-10 DIAGNOSIS — Z3009 Encounter for other general counseling and advice on contraception: Secondary | ICD-10-CM

## 2015-05-10 NOTE — Telephone Encounter (Signed)
Please advise refill? 

## 2015-05-14 ENCOUNTER — Encounter: Payer: Self-pay | Admitting: Nurse Practitioner

## 2015-05-14 ENCOUNTER — Ambulatory Visit (INDEPENDENT_AMBULATORY_CARE_PROVIDER_SITE_OTHER): Payer: Federal, State, Local not specified - PPO | Admitting: Nurse Practitioner

## 2015-05-14 VITALS — BP 118/74 | HR 81 | Temp 99.1°F | Resp 14 | Ht 63.0 in | Wt 190.2 lb

## 2015-05-14 DIAGNOSIS — K611 Rectal abscess: Secondary | ICD-10-CM | POA: Diagnosis not present

## 2015-05-14 DIAGNOSIS — Z309 Encounter for contraceptive management, unspecified: Secondary | ICD-10-CM

## 2015-05-14 DIAGNOSIS — F17210 Nicotine dependence, cigarettes, uncomplicated: Secondary | ICD-10-CM

## 2015-05-14 DIAGNOSIS — Z3049 Encounter for surveillance of other contraceptives: Secondary | ICD-10-CM | POA: Diagnosis not present

## 2015-05-14 DIAGNOSIS — Z3009 Encounter for other general counseling and advice on contraception: Secondary | ICD-10-CM

## 2015-05-14 MED ORDER — LEVONORGEST-ETH ESTRAD 91-DAY 0.15-0.03 &0.01 MG PO TABS: 1.0000 | ORAL_TABLET | Freq: Every day | ORAL | Status: DC

## 2015-05-14 MED ORDER — CYCLOBENZAPRINE HCL 5 MG PO TABS: 5.0000 mg | ORAL_TABLET | Freq: Three times a day (TID) | ORAL | Status: DC | PRN

## 2015-05-14 MED ORDER — HYDROCORTISONE 2.5 % RE CREA: 1.0000 "application " | TOPICAL_CREAM | Freq: Two times a day (BID) | RECTAL | Status: DC

## 2015-05-14 MED ORDER — SULFAMETHOXAZOLE-TRIMETHOPRIM 800-160 MG PO TABS: 1.0000 | ORAL_TABLET | Freq: Two times a day (BID) | ORAL | Status: DC

## 2015-05-14 NOTE — Patient Instructions (Addendum)
[facility]   Take the antibiotic as prescribed. Take probiotics with this.   Please take a probiotic ( Align, Floraque or Culturelle) while you are on the antibiotic to prevent a serious antibiotic associated diarrhea  Called clostirudium dificile colitis and a vaginal yeast infection.   Follow up in 2 weeks.

## 2015-05-14 NOTE — Progress Notes (Signed)
Patient ID: Brooke Walsh, female    DOB: Feb 24, 1986  Age: 29 y.o. MRN: [id number]  CC: Follow-up   HPI Brooke Walsh presents for CC of Colitis and anal pain.   1) Refill of OCP required LMP- 04/16/15 and lasts a long time stopped 3 days ago   10-25 days, heavy bleeding, has OB/GYN, but would like to see someone else.  PAP- Last year in July, normal per pt.   2) Low back pain- used flexeril in past, would like refill    3) Colonoscopy at 23, colon cancer runs in the family, patient has pain above the anus. Some anal leakage associated with the colitis. She has been doing warm sitz baths.   History Verlia has a past medical history of Asthma; Depression; Allergy; History of kidney stones; Colitis; and Anxiety.   She has past surgical history that includes Colonoscopy.   Her family history includes Alcohol abuse in her maternal grandfather and mother; Cancer in her paternal grandmother; Depression in her mother; Diabetes in her maternal grandmother; Heart disease in her maternal grandmother and mother; Hyperlipidemia in her maternal grandmother and mother; Hypertension in her maternal grandmother and mother; Kidney disease in her maternal grandfather; Stroke in her mother.She reports that she has been smoking Cigarettes.  She has been smoking about 0.50 packs per day. She has never used smokeless tobacco. She reports that she drinks alcohol. She reports that she uses illicit drugs (Marijuana).  Outpatient Prescriptions Prior to Visit  Medication Sig Dispense Refill  . albuterol (PROVENTIL HFA;VENTOLIN HFA) 108 (90 BASE) MCG/ACT inhaler Inhale 2 puffs into the lungs every 6 (six) hours as needed for wheezing or shortness of breath. 1 Inhaler 11  . ALPRAZolam (XANAX) 0.5 MG tablet Take 1 tablet (0.5 mg total) by mouth at bedtime as needed for anxiety or sleep. 30 tablet 0  . budesonide-formoterol (SYMBICORT) 160-4.5 MCG/ACT inhaler Inhale 2 puffs into the lungs 2 (two) times daily. 1 Inhaler 3  .  buPROPion (WELLBUTRIN XL) 150 MG 24 hr tablet Take 1 tablet (150 mg total) by mouth daily. 30 tablet 5  . EPINEPHrine 0.3 mg/0.3 mL IJ SOAJ injection Inject 0.3 mLs (0.3 mg total) into the muscle as needed. 1 Device 3  . lamoTRIgine (LAMICTAL) 25 MG tablet Take  po qD x14 days then increase to  po qD 60 tablet 5  . Cyclobenzaprine HCl (FLEXERIL PO) Take 1 tablet by mouth as needed.    . Levonorgestrel-Ethinyl Estradiol (AMETHIA,CAMRESE) 0.15-0.03 &0.01 MG tablet Take 1 tablet by mouth daily. 1 Package 4  . HYDROcodone-homatropine (HYCODAN) 5-1.5 MG/5ML syrup Take 5 mLs by mouth every 8 (eight) hours as needed for cough. (Patient not taking: Reported on 05/14/2015) 120 mL 0   No facility-administered medications prior to visit.    ROS Review of Systems  Constitutional: Negative for fever, chills, diaphoresis and fatigue.  Respiratory: Negative for chest tightness, shortness of breath and wheezing.   Cardiovascular: Negative for chest pain, palpitations and leg swelling.  Gastrointestinal: Positive for rectal pain. Negative for nausea, vomiting, abdominal pain, diarrhea, blood in stool and anal bleeding.  Genitourinary: Positive for menstrual problem.  Skin: Negative for rash.  Neurological: Negative for dizziness, weakness, numbness and headaches.  Psychiatric/Behavioral: The patient is not nervous/anxious.     Objective:  BP 118/74 mmHg  Pulse 81  Temp(Src) 99.1 F (37.3 C)  Resp 14  Ht  (1.6 m)  Wt 190 lb 3.2 oz (86.274 kg)  BMI 33.70 kg/m2  SpO2 96%  Physical Exam  Constitutional: She is oriented to person, place, and time. She appears well-developed and well-nourished. No distress.  HENT:  Head: Normocephalic and atraumatic.  Right Ear: External ear normal.  Left Ear: External ear normal.  Cardiovascular: Normal rate, regular rhythm, normal heart sounds and intact distal pulses.  Exam reveals no gallop and no friction rub.   No murmur heard. Pulmonary/Chest:  Effort normal and breath sounds normal. No respiratory distress. She has no wheezes. She has no rales. She exhibits no tenderness.  Genitourinary:     Small 1 cm fluctuant area, tender to palpation  Neurological: She is alert and oriented to person, place, and time. No cranial nerve deficit. She exhibits normal muscle tone. Coordination normal.  Skin: Skin is warm and dry. No rash noted. She is not diaphoretic.  Psychiatric: She has a normal mood and affect. Her behavior is normal. Judgment and thought content normal.   Assessment & Plan:   Temia was seen today for follow-up.  Diagnoses and all orders for this visit:  Birth control counseling -     Levonorgestrel-Ethinyl Estradiol (AMETHIA,CAMRESE) 0.15-0.03 &0.01 MG tablet; Take 1 tablet by mouth daily.  Cigarette nicotine dependence without complication  Peri-rectal abscess  Encounter for surveillance of other contraceptive  Other orders -     cyclobenzaprine (FLEXERIL) 5 MG tablet; Take 1 tablet (5 mg total) by mouth 3 (three) times daily as needed for muscle spasms. -     hydrocortisone (ANUSOL-HC) 2.5 % rectal cream; Place 1 application rectally 2 (two) times daily. -     sulfamethoxazole-trimethoprim (BACTRIM DS,SEPTRA DS) 800-160 MG tablet; Take 1 tablet by mouth 2 (two) times daily.   I have discontinued Ms. Skaff's Cyclobenzaprine HCl (FLEXERIL PO) and HYDROcodone-homatropine. I am also having her start on cyclobenzaprine, hydrocortisone, and sulfamethoxazole-trimethoprim. Additionally, I am having her maintain her albuterol, EPINEPHrine, budesonide-formoterol, buPROPion, lamoTRIgine, ALPRAZolam, and Levonorgestrel-Ethinyl Estradiol.  Meds ordered this encounter  Medications  . cyclobenzaprine (FLEXERIL) 5 MG tablet    Sig: Take 1 tablet (5 mg total) by mouth 3 (three) times daily as needed for muscle spasms.    Dispense:  30 tablet    Refill:  1    Order Specific Question:  Supervising Provider    Answer:  Duncan Dull L [[id number]]  . Levonorgestrel-Ethinyl Estradiol (AMETHIA,CAMRESE) 0.15-0.03 &0.01 MG tablet    Sig: Take 1 tablet by mouth daily.    Dispense:  1 Package    Refill:  4    Order Specific Question:  Supervising Provider    Answer:  Duncan Dull L [[id number]]  . hydrocortisone (ANUSOL-HC) 2.5 % rectal cream    Sig: Place 1 application rectally 2 (two) times daily.    Dispense:  30 g    Refill:  0    Order Specific Question:  Supervising Provider    Answer:  Duncan Dull L [[id number]]  . sulfamethoxazole-trimethoprim (BACTRIM DS,SEPTRA DS) 800-160 MG tablet    Sig: Take 1 tablet by mouth 2 (two) times daily.    Dispense:  14 tablet    Refill:  0    Order Specific Question:  Supervising Provider    Answer:  Sherlene Shams [[id number]]     Follow-up: Return in about 2 weeks (around 05/28/2015) for Colitis.

## 2015-05-14 NOTE — Progress Notes (Signed)
Pre visit review using our clinic review tool, if applicable. No additional management support is needed unless otherwise documented below in the visit note. 

## 2015-05-14 NOTE — Assessment & Plan Note (Signed)
Needing refills. Pt brought up extensive bleeding with menstrual cycles. Pt looking for new Ob/GYN- suggested [facility] and she will establish care with them per verbalization. Will follow

## 2015-05-14 NOTE — Assessment & Plan Note (Signed)
Pt declined I & D today. She is stable, afebrile, and not in distress. She is experiencing pain with palpation and sitting is painful. Discussed options and will try Bactrim x 7 days and sitz baths. Follow up in 2 weeks or sooner if fever.

## 2015-05-14 NOTE — Assessment & Plan Note (Signed)
Discussed quitting smoking. Pt interested. She has not tried anything before. Discussed using OTC patches, lozenges, gum then if fails will try something else.

## 2015-05-17 ENCOUNTER — Telehealth: Payer: Self-pay

## 2015-05-17 ENCOUNTER — Encounter: Payer: Federal, State, Local not specified - PPO | Admitting: General Surgery

## 2015-05-17 ENCOUNTER — Other Ambulatory Visit: Payer: Self-pay | Admitting: Nurse Practitioner

## 2015-05-17 ENCOUNTER — Encounter: Payer: Self-pay | Admitting: General Surgery

## 2015-05-17 DIAGNOSIS — K610 Anal abscess: Secondary | ICD-10-CM

## 2015-05-17 MED ORDER — TRAMADOL HCL 50 MG PO TABS: 50.0000 mg | ORAL_TABLET | Freq: Three times a day (TID) | ORAL | Status: DC | PRN

## 2015-05-17 NOTE — Telephone Encounter (Signed)
Patient notified and verbalized understanding. 

## 2015-05-17 NOTE — Telephone Encounter (Signed)
There are over the counter creams at any drug store...usually around hemorrhoid sections that have numbing medication (lidocaine). There is a recticare cream at [facility] that is good.
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
## 2015-05-17 NOTE — Telephone Encounter (Signed)
I placed a referral for general surgery to see her urgently to drain the area. I will give her something for pain, but she cannot drive herself and she will not be in any condition to take a test or focus at school. I will send Tramadol to the pharmacy.

## 2015-05-17 NOTE — Telephone Encounter (Signed)
Patient wants to know if there is any type of cream that could be used temporarily for the pain? Thanks!

## 2015-05-17 NOTE — Telephone Encounter (Signed)
Patient left a message stating that she was given an antibiotic, and a cream for a cyst. Patietn states that she cant even drive or go to school because it hurts so bad, and she can't sit through her mid-term. Patient says that she has to go to school tomorrow, and needs something that will help this pain. Please advise. Thank you!

## 2015-05-17 NOTE — Progress Notes (Signed)
This encounter was created in error - please disregard.

## 2015-05-18 ENCOUNTER — Telehealth: Payer: Self-pay | Admitting: *Deleted

## 2015-05-18 ENCOUNTER — Emergency Department
Admission: EM | Admit: 2015-05-18 | Discharge: 2015-05-18 | Disposition: A | Discharge status: Home / Self Care | Payer: Federal, State, Local not specified - PPO | Attending: Emergency Medicine | Admitting: Emergency Medicine

## 2015-05-18 ENCOUNTER — Encounter: Payer: Self-pay | Admitting: Emergency Medicine

## 2015-05-18 DIAGNOSIS — Z880 Allergy status to penicillin: Secondary | ICD-10-CM | POA: Diagnosis not present

## 2015-05-18 DIAGNOSIS — L0291 Cutaneous abscess, unspecified: Secondary | ICD-10-CM

## 2015-05-18 DIAGNOSIS — K611 Rectal abscess: Secondary | ICD-10-CM | POA: Diagnosis not present

## 2015-05-18 DIAGNOSIS — Z720 Tobacco use: Secondary | ICD-10-CM | POA: Insufficient documentation

## 2015-05-18 DIAGNOSIS — L0231 Cutaneous abscess of buttock: Secondary | ICD-10-CM | POA: Diagnosis present

## 2015-05-18 HISTORY — DX: Disorder of kidney and ureter, unspecified: N28.9

## 2015-05-18 MED ORDER — OXYCODONE-ACETAMINOPHEN 5-325 MG PO TABS
ORAL_TABLET | ORAL | Status: AC
  Administered 2015-05-18: 2 via ORAL
  Filled 2015-05-18: qty 2

## 2015-05-18 MED ORDER — OXYCODONE-ACETAMINOPHEN 5-325 MG PO TABS: 2.0000 | ORAL_TABLET | Freq: Four times a day (QID) | ORAL | Status: DC | PRN

## 2015-05-18 MED ORDER — LIDOCAINE-EPINEPHRINE (PF) 1 %-1:200000 IJ SOLN
INTRAMUSCULAR | Status: AC
  Administered 2015-05-18: 10 mL
  Filled 2015-05-18: qty 30

## 2015-05-18 MED ORDER — OXYCODONE-ACETAMINOPHEN 5-325 MG PO TABS
2.0000 | ORAL_TABLET | Freq: Once | ORAL | Status: AC
  Administered 2015-05-18: 2 via ORAL

## 2015-05-18 MED ORDER — LIDOCAINE-EPINEPHRINE (PF) 2 %-1:200000 IJ SOLN: 10.0000 mL | Freq: Once | INTRAMUSCULAR | Status: DC

## 2015-05-18 MED ORDER — SULFAMETHOXAZOLE-TRIMETHOPRIM 800-160 MG PO TABS: 14.0000 | ORAL_TABLET | Freq: Two times a day (BID) | ORAL | Status: DC

## 2015-05-18 NOTE — Discharge Instructions (Signed)

## 2015-05-18 NOTE — Telephone Encounter (Signed)
Brooke Walsh,  I have called [facility] general surgery, [facility] in [place] and [facility] general surgery and no one has availability today. I'll let the patient know, unless you know of anyone else.
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
## 2015-05-18 NOTE — Telephone Encounter (Signed)
Thank you for your help. She will just have to figure something out with work. Thanks!

## 2015-05-18 NOTE — ED Notes (Signed)
MD at bedside. 

## 2015-05-18 NOTE — Telephone Encounter (Signed)
Patient was seen Friday, patient was given a referral to [facility].Patient could not be seen yesterday 10/03. Patient stated that the next available time, that [facility] could see her would be on Wednesday, however patient stated that she could not take any more time off from work. Her question was; can she get a referral to another place that can take her today.

## 2015-05-18 NOTE — ED Notes (Signed)
Pt c/o abscess to the buttock since Friday and was seen by PCP today and referred to the ED for tx.

## 2015-05-18 NOTE — Telephone Encounter (Signed)
Melissas any help? I think this is a request that is unobtainable. Maybe [facility] can see her today or [facility]?

## 2015-05-18 NOTE — ED Notes (Deleted)
Patient had an abscess on his right forearm packed on Saturday with instructions to have it unpacked and re-packed within 48 hours.  Patient was unable to be seen at his primary care provider today.  No obvious distress at this time.  No signs or symptoms of systemic infection.

## 2015-05-19 ENCOUNTER — Ambulatory Visit: Payer: Federal, State, Local not specified - PPO | Admitting: General Surgery

## 2015-05-21 ENCOUNTER — Encounter: Payer: Self-pay | Admitting: Nurse Practitioner

## 2015-05-21 ENCOUNTER — Ambulatory Visit (INDEPENDENT_AMBULATORY_CARE_PROVIDER_SITE_OTHER): Payer: Federal, State, Local not specified - PPO | Admitting: Nurse Practitioner

## 2015-05-21 VITALS — BP 104/80 | HR 60 | Temp 98.9°F | Resp 14 | Ht 65.0 in | Wt 187.2 lb

## 2015-05-21 DIAGNOSIS — K611 Rectal abscess: Secondary | ICD-10-CM

## 2015-05-21 NOTE — Progress Notes (Signed)
Pre visit review using our clinic review tool, if applicable. No additional management support is needed unless otherwise documented below in the visit note. 

## 2015-05-21 NOTE — Patient Instructions (Signed)
Finish prescribed antibiotics and take a stool softener as needed.

## 2015-05-21 NOTE — Assessment & Plan Note (Signed)
Perianal abscess was drained in the ER on 05/18/2015. She was advised to take out the iodoform packing the next day. She reports feeling around could not find it so she came in today to have it evaluated. Patient is taking medications as prescribed and advised her to do so. Encouraged probiotics. After an exam of the area looks to be well healing, it is less painful to the patient, no signs of infection, and no sign of iodoform packing. The shallow pockets that are healing were evaluated with a pair of sterile adson forceps. Will follow as needed

## 2015-05-21 NOTE — Progress Notes (Signed)
Patient ID: Brooke Walsh, female    DOB: [birth year]  Age: 29 y.o. MRN: [id number]  CC: Follow-up   HPI Brooke Walsh presents for post-ER evaluation of rectal abscess that was drained.  1) patient reports taking the antibiotics as prescribed and the pain medications as needed. Patient reports a slight constipation with this. No other drainage or problems noted. She does feel relief.  History Brooke Walsh has a past medical history of Asthma; Depression; Allergy; History of kidney stones; Colitis; Anxiety; and Renal disorder.   She has past surgical history that includes Colonoscopy (2010).   Her family history includes Alcohol abuse in her maternal grandfather and mother; Cancer in her paternal grandmother; Depression in her mother; Diabetes in her maternal grandmother; Heart disease in her maternal grandmother and mother; Hyperlipidemia in her maternal grandmother and mother; Hypertension in her maternal grandmother and mother; Kidney disease in her maternal grandfather; Stroke in her mother.She reports that she has been smoking Cigarettes.  She has been smoking about 0.50 packs per day. She has never used smokeless tobacco. She reports that she drinks alcohol. She reports that she uses illicit drugs (Marijuana).  Outpatient Prescriptions Prior to Visit  Medication Sig Dispense Refill  . albuterol (PROVENTIL HFA;VENTOLIN HFA) 108 (90 BASE) MCG/ACT inhaler Inhale 2 puffs into the lungs every 6 (six) hours as needed for wheezing or shortness of breath. 1 Inhaler 11  . ALPRAZolam (XANAX) 0.5 MG tablet Take 1 tablet (0.5 mg total) by mouth at bedtime as needed for anxiety or sleep. 30 tablet 0  . budesonide-formoterol (SYMBICORT) 160-4.5 MCG/ACT inhaler Inhale 2 puffs into the lungs 2 (two) times daily. 1 Inhaler 3  . buPROPion (WELLBUTRIN XL) 150 MG 24 hr tablet Take 1 tablet (150 mg total) by mouth daily. 30 tablet 5  . cyclobenzaprine (FLEXERIL) 5 MG tablet Take 1 tablet (5 mg total) by mouth 3 (three)  times daily as needed for muscle spasms. 30 tablet 1  . EPINEPHrine 0.3 mg/0.3 mL IJ SOAJ injection Inject 0.3 mLs (0.3 mg total) into the muscle as needed. 1 Device 3  . hydrocortisone (ANUSOL-HC) 2.5 % rectal cream Place 1 application rectally 2 (two) times daily. 30 g 0  . Levonorgestrel-Ethinyl Estradiol (AMETHIA,CAMRESE) 0.15-0.03 &0.01 MG tablet Take 1 tablet by mouth daily. 1 Package 4  . oxyCODONE-acetaminophen (PERCOCET) 5-325 MG tablet Take 2 tablets by mouth every 6 (six) hours as needed for moderate pain or severe pain. 30 tablet 0  . sulfamethoxazole-trimethoprim (BACTRIM DS) 800-160 MG tablet Take 14 tablets by mouth 2 (two) times daily. 6 tablet 0  . sulfamethoxazole-trimethoprim (BACTRIM DS,SEPTRA DS) 800-160 MG tablet Take 1 tablet by mouth 2 (two) times daily. 14 tablet 0  . traMADol (ULTRAM) 50 MG tablet Take 1 tablet (50 mg total) by mouth every 8 (eight) hours as needed. 30 tablet 0   No facility-administered medications prior to visit.    ROS Review of Systems  Constitutional: Negative for fever, chills, diaphoresis and fatigue.  Respiratory: Negative for chest tightness, shortness of breath and wheezing.   Cardiovascular: Negative for chest pain, palpitations and leg swelling.  Gastrointestinal: Positive for rectal pain. Negative for nausea, vomiting and diarrhea.  Neurological: Negative for dizziness and headaches.  Psychiatric/Behavioral: The patient is nervous/anxious.     Objective:  BP 104/80 mmHg  Pulse 60  Temp(Src) 98.9 F (37.2 C)  Resp 14  Ht  (1.651 m)  Wt 187 lb 3.2 oz (84.913 kg)  BMI 31.15 kg/m2  SpO2 97%  LMP 05/04/2015  Physical Exam  Constitutional: She is oriented to person, place, and time. She appears well-developed and well-nourished. No distress.  HENT:  Head: Normocephalic and atraumatic.  Right Ear: External ear normal.  Left Ear: External ear normal.  Genitourinary:     Shallow well-healing small pockets where the  abscess use to be. No sign of by iodoform packing  Neurological: She is alert and oriented to person, place, and time. No cranial nerve deficit. She exhibits normal muscle tone. Coordination normal.  Skin: Skin is warm and dry. No rash noted. She is not diaphoretic.  Psychiatric: She has a normal mood and affect. Her behavior is normal. Judgment and thought content normal.   Assessment & Plan:   Brooke Walsh was seen today for follow-up.  Diagnoses and all orders for this visit:  Peri-rectal abscess  I am having Brooke Walsh maintain her albuterol, EPINEPHrine, budesonide-formoterol, buPROPion, ALPRAZolam, cyclobenzaprine, Levonorgestrel-Ethinyl Estradiol, hydrocortisone, sulfamethoxazole-trimethoprim, traMADol, sulfamethoxazole-trimethoprim, and oxyCODONE-acetaminophen.  No orders of the defined types were placed in this encounter.     Follow-up: Return if symptoms worsen or fail to improve.

## 2015-05-28 ENCOUNTER — Encounter: Payer: Self-pay | Admitting: Family Medicine

## 2015-05-28 ENCOUNTER — Ambulatory Visit (INDEPENDENT_AMBULATORY_CARE_PROVIDER_SITE_OTHER): Payer: Federal, State, Local not specified - PPO | Admitting: Family Medicine

## 2015-05-28 VITALS — BP 128/74 | HR 76 | Temp 98.4°F | Ht 65.0 in | Wt 188.0 lb

## 2015-05-28 DIAGNOSIS — N6310 Unspecified lump in the right breast, unspecified quadrant: Secondary | ICD-10-CM | POA: Insufficient documentation

## 2015-05-28 DIAGNOSIS — N63 Unspecified lump in breast: Secondary | ICD-10-CM | POA: Diagnosis not present

## 2015-05-28 NOTE — Assessment & Plan Note (Signed)
No focal mass noted today. This is likely dense breast tissue. However given patient's concern as well as family history will obtain mammogram.

## 2015-05-28 NOTE — Patient Instructions (Signed)
I do not feel a discrete mass on exam. This is likely dense breast tissue.  We will call with your mammogram results.  Take care  Dr. Adriana Simasook

## 2015-05-28 NOTE — Progress Notes (Signed)
   Subjective:  Patient ID: Brooke Walsh, female    DOB: 1986/02/21  Age: 29 y.o. MRN: [id number]  CC: Breast lump/mass  HPI:  29 year old female with a past medical history of bipolar 2, panic disorder, generalized anxiety disorder presents with complaints of a right breast lump.  Breast lump  Patient reports that she felt a lump in her right breast approximately 3 days ago.  She reports it is nonpainful.  No associated fevers, chills. No drainage from the breast. No reported nipple discharge.  Last menstrual cycle was at the end of last month.  She reports a family history of breast cancer - paternal grandmother.  Social Hx   Social History   Social History  . Marital Status: Single    Spouse Name: N/A  . Number of Children: 0  . Years of Education: 14   Occupational History  . 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 Smoking status: Current Every Day Smoker -- 0.50 packs/day    Types: Cigarettes  . Smokeless tobacco: Never Used  . Alcohol Use: 0.0 oz/week    0 Standard drinks or equivalent per week     Comment: a couple of shots before bed every night  . Drug Use: Yes    Special: Marijuana     Comment: daily- a quater a week  . Sexual Activity: Not Currently   Other Topics Concern  . None   Social History Narrative   [name] grew up in [place], [place]. She moved to the [place] ~ 2 year ago and works for the [employer]. She is single.      Hobbies: Enjoys spending time with friends   Exercise: Occasional         Review of Systems  Constitutional: Negative.   Breast - reports lump/mass.  Objective:  BP 128/74 mmHg  Pulse 76  Temp(Src) 98.4 F (36.9 C) (Oral)  Ht 5\' 5"  (1.651 m)  Wt 188 lb (85.276 kg)  BMI 31.28 kg/m2  SpO2 97%  LMP 05/04/2015  BP/Weight 05/28/2015 05/21/2015 05/18/2015  Systolic BP 128 104 124  Diastolic BP 74 80 87  Wt. (Lbs) 188 187.2 190  BMI 31.28 31.15 33.67  Some encounter  information is confidential and restricted. Go to Review Flowsheets activity to see all data.   Physical Exam  Constitutional: She is oriented to person, place, and time. She appears well-developed and well-nourished. No distress.  Pulmonary/Chest: Effort normal.  Breasts: breasts appear normal, no appreciable masses, no skin or nipple changes or axillary nodes. Dense breast tissue present.    Neurological: She is alert and oriented to person, place, and time.  Psychiatric: She has a normal mood and affect.  Vitals reviewed.  Assessment & Plan:   Problem List Items Addressed This Visit    Breast mass, right - Primary    No focal mass noted today. This is likely dense breast tissue. However given patient's concern as well as family history will obtain mammogram.      Relevant Orders   MM Digital Screening     Follow-up: PRN  Everlene OtherJayce Afomia Blackley, DO

## 2015-06-01 ENCOUNTER — Ambulatory Visit: Payer: Self-pay | Admitting: Nurse Practitioner

## 2015-06-03 ENCOUNTER — Ambulatory Visit: Payer: Self-pay | Admitting: Nurse Practitioner

## 2015-06-07 ENCOUNTER — Telehealth: Payer: Self-pay | Admitting: Nurse Practitioner

## 2015-06-07 ENCOUNTER — Other Ambulatory Visit: Payer: Self-pay | Admitting: Nurse Practitioner

## 2015-06-07 MED ORDER — SULFAMETHOXAZOLE-TRIMETHOPRIM 800-160 MG PO TABS: 1.0000 | ORAL_TABLET | Freq: Two times a day (BID) | ORAL | Status: DC

## 2015-06-07 NOTE — Telephone Encounter (Signed)
Spoke with the patient, verbalized understanding 

## 2015-06-07 NOTE — Telephone Encounter (Signed)
Please advise 

## 2015-06-07 NOTE — Telephone Encounter (Signed)
Sent in another round of Bactrim DS.

## 2015-06-07 NOTE — Telephone Encounter (Signed)
Pt called stating that the cyst is coming back and wants to know if she can get a antibiotic until her next appt.. Please advise pt

## 2015-06-08 ENCOUNTER — Ambulatory Visit: Payer: Self-pay | Admitting: Nurse Practitioner

## 2015-06-08 ENCOUNTER — Other Ambulatory Visit: Payer: Self-pay | Admitting: Family Medicine

## 2015-06-08 DIAGNOSIS — N6310 Unspecified lump in the right breast, unspecified quadrant: Secondary | ICD-10-CM

## 2015-06-11 ENCOUNTER — Ambulatory Visit: Payer: Federal, State, Local not specified - PPO | Attending: Family Medicine

## 2015-06-15 ENCOUNTER — Ambulatory Visit: Payer: Self-pay | Admitting: Nurse Practitioner

## 2015-06-15 DIAGNOSIS — Z0289 Encounter for other administrative examinations: Secondary | ICD-10-CM

## 2015-06-24 ENCOUNTER — Ambulatory Visit (INDEPENDENT_AMBULATORY_CARE_PROVIDER_SITE_OTHER): Payer: Federal, State, Local not specified - PPO | Admitting: Psychiatry

## 2015-06-24 ENCOUNTER — Encounter (HOSPITAL_COMMUNITY): Payer: Self-pay | Admitting: Psychiatry

## 2015-06-24 VITALS — BP 115/76 | HR 78 | Ht 62.0 in | Wt 188.6 lb

## 2015-06-24 DIAGNOSIS — F410 Panic disorder [episodic paroxysmal anxiety] without agoraphobia: Secondary | ICD-10-CM | POA: Diagnosis not present

## 2015-06-24 DIAGNOSIS — F3181 Bipolar II disorder: Secondary | ICD-10-CM | POA: Diagnosis not present

## 2015-06-24 DIAGNOSIS — F411 Generalized anxiety disorder: Secondary | ICD-10-CM | POA: Diagnosis not present

## 2015-06-24 DIAGNOSIS — Z79899 Other long term (current) drug therapy: Secondary | ICD-10-CM | POA: Diagnosis not present

## 2015-06-24 MED ORDER — BUPROPION HCL ER (XL) 300 MG PO TB24: 300.0000 mg | ORAL_TABLET | Freq: Every day | ORAL | Status: DC

## 2015-06-24 MED ORDER — ALPRAZOLAM 0.5 MG PO TABS: 0.5000 mg | ORAL_TABLET | Freq: Every evening | ORAL | Status: AC | PRN

## 2015-06-24 MED ORDER — ARIPIPRAZOLE 5 MG PO TABS: 2.5000 mg | ORAL_TABLET | Freq: Every day | ORAL | Status: AC
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
## 2015-06-24 NOTE — Patient Instructions (Signed)
EKG call [phone] 

## 2015-07-14 ENCOUNTER — Encounter: Payer: Self-pay | Admitting: *Deleted

## 2015-08-02 ENCOUNTER — Ambulatory Visit (HOSPITAL_COMMUNITY): Payer: Self-pay | Admitting: Clinical

## 2015-08-17 ENCOUNTER — Ambulatory Visit (HOSPITAL_COMMUNITY): Payer: Self-pay | Admitting: Clinical

## 2015-08-26 ENCOUNTER — Ambulatory Visit (HOSPITAL_COMMUNITY): Payer: Self-pay | Admitting: Psychiatry

## 2015-09-09 ENCOUNTER — Ambulatory Visit (HOSPITAL_COMMUNITY): Payer: Self-pay | Admitting: Clinical

## 2015-09-28 ENCOUNTER — Telehealth (HOSPITAL_COMMUNITY): Payer: Self-pay

## 2015-09-28 ENCOUNTER — Ambulatory Visit (HOSPITAL_COMMUNITY): Payer: Self-pay | Admitting: Psychiatry

## 2016-02-22 ENCOUNTER — Telehealth: Payer: Self-pay | Admitting: *Deleted

## 2016-02-22 NOTE — Telephone Encounter (Signed)
Patient has requested to a diflucan called into the pharmacy, she was out of the town when she had to visit a urgent care, she was prescribed a antibiotic that cause he to have a yeast infection. Pt contact [phone]

## 2016-02-23 ENCOUNTER — Other Ambulatory Visit: Payer: Self-pay | Admitting: Family Medicine

## 2016-02-23 MED ORDER — FLUCONAZOLE 150 MG PO TABS: 150.0000 mg | ORAL_TABLET | Freq: Once | ORAL | Status: DC

## 2016-02-23 NOTE — Telephone Encounter (Signed)
No PCP follow up, please advise for Medication, thanks

## 2016-03-01 ENCOUNTER — Other Ambulatory Visit: Payer: Self-pay | Admitting: Nurse Practitioner

## 2016-03-02 NOTE — Telephone Encounter (Signed)
Please advise on refill. Pt has not been seen in our clinic since 05/2015. No upcoming appts.

## 2016-06-28 ENCOUNTER — Ambulatory Visit (INDEPENDENT_AMBULATORY_CARE_PROVIDER_SITE_OTHER): Payer: Federal, State, Local not specified - PPO | Admitting: Family Medicine

## 2016-06-28 ENCOUNTER — Ambulatory Visit (INDEPENDENT_AMBULATORY_CARE_PROVIDER_SITE_OTHER): Payer: Federal, State, Local not specified - PPO

## 2016-06-28 ENCOUNTER — Encounter: Payer: Self-pay | Admitting: Family Medicine

## 2016-06-28 VITALS — BP 114/82 | HR 111 | Temp 98.3°F | Resp 16 | Wt 193.2 lb

## 2016-06-28 DIAGNOSIS — J209 Acute bronchitis, unspecified: Secondary | ICD-10-CM | POA: Diagnosis not present

## 2016-06-28 DIAGNOSIS — R05 Cough: Secondary | ICD-10-CM | POA: Diagnosis not present

## 2016-06-28 MED ORDER — HYDROCOD POLST-CPM POLST ER 10-8 MG/5ML PO SUER: 5.0000 mL | Freq: Two times a day (BID) | ORAL | 0 refills | Status: DC | PRN

## 2016-06-28 MED ORDER — PREDNISONE 50 MG PO TABS: ORAL_TABLET | ORAL | 0 refills | Status: DC

## 2016-06-28 NOTE — Progress Notes (Signed)
Subjective:  Patient ID: Brooke Walsh, female    DOB: April 11, 1986  Age: 30 y.o. MRN: [id number]  CC: Cough  HPI:  30 year old female with bipolar disorder, tobacco abuse presents with complaints of cough.  Patient reports that she's had a nonproductive cough for the past month. She had a worsening of her cough approximately 3 days ago. Worse at night. She reports associated chills and subjective fever. She has not taken her temperature. She reports that her throat is "raw". No other associated symptoms. She is a daily smoker. She's been taking cough medication and using an albuterol inhaler without significant improvement. No other complaints or issues at this time.  Social Hx   Social History   Social History  . Marital status: Single    Spouse name: N/A  . Number of children: 0  . Years of education: 14   Occupational History  . 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 Smoking status: Current Every Day Smoker    Packs/day: 0.50    Types: Cigarettes  . Smokeless tobacco: Never Used  . Alcohol use 0.0 oz/week     Comment: now drinking on weekends only- 3 mixed drinks per episode  . Drug use:     Types: Marijuana     Comment: daily- a quater a week  . Sexual activity: Not Currently   Other Topics Concern  . None   Social History Narrative   [name] grew up in [place], [place]. She moved to the [place] ~ 2 year ago and works for the [employer]. She is single.      Hobbies: Enjoys spending time with friends   Exercise: Occasional         Review of Systems  Constitutional: Positive for chills.  HENT: Positive for sore throat.   Respiratory: Positive for cough.    Objective:  BP 114/82 (BP Location: Right Arm, Patient Position: Sitting, Cuff Size: Large)   Pulse (!) 111   Temp 98.3 F (36.8 C) (Oral)   Resp 16   Wt 193 lb 4 oz (87.7 kg)   SpO2 95%   BMI 35.35 kg/m   BP/Weight 06/28/2016 05/28/2015 05/21/2015    Systolic BP 114 128 104  Diastolic BP 82 74 80  Wt. (Lbs) 193.25 188 187.2  BMI 35.35 31.28 31.15  Some encounter information is confidential and restricted. Go to Review Flowsheets activity to see all data.   Physical Exam  Constitutional: She is oriented to person, place, and time. She appears well-developed. No distress.  HENT:  Oropharyngeal erythema. No exudate.  Cardiovascular: Regular rhythm.  Tachycardia present.   Pulmonary/Chest: Effort normal.  Wheezing throughout. Worse with cough.  Neurological: She is alert and oriented to person, place, and time.  Psychiatric: She has a normal mood and affect.  Vitals reviewed.  Lab Results  Component Value Date   WBC 11.9 (H) 02/16/2014   HGB 13.9 02/16/2014   HCT 41.4 02/16/2014   PLT 318.0 02/16/2014   GLUCOSE 95 02/16/2014   CHOL 179 02/16/2014   TRIG 114.0 02/16/2014   HDL 59.10 02/16/2014   LDLCALC 97 02/16/2014   NA 139 02/16/2014   K 4.0 02/16/2014   CL 109 02/16/2014   CREATININE 0.9 02/16/2014   BUN 12 02/16/2014   CO2 21 02/16/2014   TSH 1.23 02/16/2014   Assessment & Plan:   Problem List Items Addressed This Visit    Acute bronchitis - Primary  New acute problem. Treating with prednisone and Tussionex. Chest x-ray today.      Relevant Orders   DG Chest 2 View     Meds ordered this encounter  Medications  . predniSONE (DELTASONE) 50 MG tablet    Sig: 1 tablet daily x 5 days.    Dispense:  5 tablet    Refill:  0  . chlorpheniramine-HYDROcodone (TUSSIONEX PENNKINETIC ER) 10-8 MG/5ML SUER    Sig: Take 5 mLs by mouth every 12 (twelve) hours as needed.    Dispense:  115 mL    Refill:  0   Follow-up: PRN  Everlene OtherJayce Y[place]ocks DO [facility] [place]

## 2016-06-28 NOTE — Patient Instructions (Signed)
Take the medications as prescribed.  We will call regarding her chest x-ray results.  Take care  Dr. Adriana Simasook

## 2016-06-28 NOTE — Assessment & Plan Note (Signed)
New acute problem. Treating with prednisone and Tussionex. Chest x-ray today.

## 2016-06-28 NOTE — Progress Notes (Signed)
Pre visit review using our clinic review tool, if applicable. No additional management support is needed unless otherwise documented below in the visit note. 

## 2016-07-03 ENCOUNTER — Telehealth: Payer: Self-pay | Admitting: *Deleted

## 2016-07-03 NOTE — Telephone Encounter (Signed)
Spoke with patient states she has finished steroid and continues cough.  Cough is present in the day and is sometimes productive with phlegm and keeps her awake at night.   She has been using OTC Mucinex DM not helping.    Would like work note for 11/15/ and 11/16 Please advise.

## 2016-07-03 NOTE — Telephone Encounter (Signed)
Okay to give note 

## 2016-07-03 NOTE — Telephone Encounter (Signed)
Pt continues to have a cough since her last visit on 11/15. She has finished her steroid and continues to have a cough. She requested a call to discuss the next steps to control her cough , she also requested a work note from the visit on 11/15 Pt contact [phone]

## 2016-07-04 NOTE — Telephone Encounter (Signed)
Letter was printed and pt was called where a voicemail was left for letter pick up

## 2016-07-10 NOTE — Telephone Encounter (Signed)
Pt states that she still has a bad cough and would like something to help.Marland Kitchen. Please advise

## 2016-10-12 ENCOUNTER — Encounter: Payer: Self-pay | Admitting: Family Medicine

## 2016-10-12 ENCOUNTER — Ambulatory Visit (INDEPENDENT_AMBULATORY_CARE_PROVIDER_SITE_OTHER): Payer: Federal, State, Local not specified - PPO | Admitting: Family Medicine

## 2016-10-12 DIAGNOSIS — K529 Noninfective gastroenteritis and colitis, unspecified: Secondary | ICD-10-CM | POA: Diagnosis not present

## 2016-10-12 MED ORDER — ONDANSETRON HCL 4 MG PO TABS: 4.0000 mg | ORAL_TABLET | Freq: Three times a day (TID) | ORAL | 0 refills | Status: DC | PRN

## 2016-10-12 NOTE — Progress Notes (Signed)
Pre visit review using our clinic review tool, if applicable. No additional management support is needed unless otherwise documented below in the visit note. 

## 2016-10-12 NOTE — Assessment & Plan Note (Signed)
New acute problem. History consistent with viral gastroenteritis. Treating with Zofran. Advised aggressive hydration. If unable to hydrate and continues to have persistent symptoms, will need to go to the ER for IV fluids.

## 2016-10-12 NOTE — Progress Notes (Signed)
Subjective:  Patient ID: Brooke Walsh, female    DOB: 04-07-1986  Age: 31 y.o. MRN: [id number]  CC: N/V/D  HPI:  31 year old female presents with nausea, vomiting, diarrhea.  She states that she's been sick since Monday. She's had persistent nausea, vomiting, diarrhea. No associated fever. She has had some night sweats. She's been trying to stay hydrated but is still having persistent symptoms. No associated abdominal pain. No other associated symptoms. No known exacerbating factors. No other complaints or concerns at this time.  Social Hx   Social History   Social History  . Marital status: Single    Spouse name: N/A  . Number of children: 0  . Years of education: 14   Occupational History  . 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 Smoking status: Current Every Day Smoker    Packs/day: 0.50    Types: Cigarettes  . Smokeless tobacco: Never Used  . Alcohol use 0.0 oz/week     Comment: now drinking on weekends only- 3 mixed drinks per episode  . Drug use: Yes    Types: Marijuana     Comment: daily- a quater a week  . Sexual activity: Not Currently   Other Topics Concern  . None   Social History Narrative   Brooke Walsh grew up in [place], [place]. She moved to the [place] ~ 2 year ago and works for the [employer]. She is single.      Hobbies: Enjoys spending time with friends   Exercise: Occasional          Review of Systems  Constitutional: Negative for fever.  Gastrointestinal: Positive for diarrhea, nausea and vomiting. Negative for abdominal pain.  All other systems reviewed and are negative.  Objective:  BP 120/68   Pulse (!) 107   Temp 99.2 F (37.3 C) (Oral)   Wt 207 lb 3.2 oz (94 kg)   SpO2 97%   BMI 37.90 kg/m   BP/Weight 10/12/2016 06/28/2016 05/28/2015  Systolic BP 120 114 128  Diastolic BP 68 82 74  Wt. (Lbs) 207.2 193.25 188  BMI 37.9 35.35 31.28  Some encounter information is confidential and  restricted. Go to Review Flowsheets activity to see all data.   Physical Exam  Constitutional: She is oriented to person, place, and time. She appears well-developed. No distress.  HENT:  Mouth/Throat: Oropharynx is clear and moist.  Dental caries noted.  Cardiovascular:  Tachycardic.  Pulmonary/Chest: Effort normal and breath sounds normal.  Abdominal: Soft. She exhibits no distension. There is no tenderness. There is no rebound and no guarding.  Neurological: She is alert and oriented to person, place, and time.  Psychiatric:  Flat affect.  Vitals reviewed.  Lab Results  Component Value Date   WBC 11.9 (H) 02/16/2014   HGB 13.9 02/16/2014   HCT 41.4 02/16/2014   PLT 318.0 02/16/2014   GLUCOSE 95 02/16/2014   CHOL 179 02/16/2014   TRIG 114.0 02/16/2014   HDL 59.10 02/16/2014   LDLCALC 97 02/16/2014   NA 139 02/16/2014   K 4.0 02/16/2014   CL 109 02/16/2014   CREATININE 0.9 02/16/2014   BUN 12 02/16/2014   CO2 21 02/16/2014   TSH 1.23 02/16/2014    Assessment & Plan:   Problem List Items Addressed This Visit    Gastroenteritis    New acute problem. History consistent with viral gastroenteritis. Treating with Zofran. Advised aggressive hydration. If unable to hydrate and  continues to have persistent symptoms, will need to go to the ER for IV fluids.         Meds ordered this encounter  Medications  . ondansetron (ZOFRAN) 4 MG tablet    Sig: Take 1 tablet (4 mg total) by mouth every 8 (eight) hours as needed for nausea or vomiting.    Dispense:  20 tablet    Refill:  0    Follow-up: PRN  Everlene OtherJayce Elliot Simoneaux DO [facility] [place]

## 2016-10-12 NOTE — Patient Instructions (Signed)

## 2017-01-22 ENCOUNTER — Telehealth: Payer: Self-pay | Admitting: Family Medicine

## 2017-01-22 NOTE — Telephone Encounter (Signed)
Pt called wanting to get a copy of a doctors note dated from 10/11/2016-10/16/2016? Please advise?  Call pt @ [phone]. Thank you!

## 2017-01-23 NOTE — Telephone Encounter (Signed)
Patient is coming to pick up letter at front desk.

## 2017-05-15 ENCOUNTER — Emergency Department: Payer: Federal, State, Local not specified - PPO

## 2017-05-15 ENCOUNTER — Emergency Department
Admission: EM | Admit: 2017-05-15 | Discharge: 2017-05-15 | Disposition: A | Discharge status: Home / Self Care | Payer: Federal, State, Local not specified - PPO | Attending: Emergency Medicine | Admitting: Emergency Medicine

## 2017-05-15 ENCOUNTER — Encounter: Payer: Self-pay | Admitting: Emergency Medicine

## 2017-05-15 DIAGNOSIS — Y939 Activity, unspecified: Secondary | ICD-10-CM | POA: Diagnosis not present

## 2017-05-15 DIAGNOSIS — Z79899 Other long term (current) drug therapy: Secondary | ICD-10-CM | POA: Diagnosis not present

## 2017-05-15 DIAGNOSIS — S199XXA Unspecified injury of neck, initial encounter: Secondary | ICD-10-CM | POA: Diagnosis not present

## 2017-05-15 DIAGNOSIS — Y999 Unspecified external cause status: Secondary | ICD-10-CM | POA: Diagnosis not present

## 2017-05-15 DIAGNOSIS — S0083XA Contusion of other part of head, initial encounter: Secondary | ICD-10-CM | POA: Diagnosis not present

## 2017-05-15 DIAGNOSIS — F17210 Nicotine dependence, cigarettes, uncomplicated: Secondary | ICD-10-CM | POA: Diagnosis not present

## 2017-05-15 DIAGNOSIS — S161XXA Strain of muscle, fascia and tendon at neck level, initial encounter: Secondary | ICD-10-CM | POA: Diagnosis not present

## 2017-05-15 DIAGNOSIS — M542 Cervicalgia: Secondary | ICD-10-CM | POA: Diagnosis not present

## 2017-05-15 DIAGNOSIS — Y929 Unspecified place or not applicable: Secondary | ICD-10-CM | POA: Insufficient documentation

## 2017-05-15 DIAGNOSIS — S0990XA Unspecified injury of head, initial encounter: Secondary | ICD-10-CM | POA: Diagnosis not present

## 2017-05-15 DIAGNOSIS — J45909 Unspecified asthma, uncomplicated: Secondary | ICD-10-CM | POA: Diagnosis not present

## 2017-05-15 DIAGNOSIS — R51 Headache: Secondary | ICD-10-CM | POA: Diagnosis not present

## 2017-05-15 MED ORDER — TRAMADOL HCL 50 MG PO TABS
50.0000 mg | ORAL_TABLET | Freq: Once | ORAL | Status: AC
  Administered 2017-05-15: 50 mg via ORAL
  Filled 2017-05-15: qty 1

## 2017-05-15 MED ORDER — TRAMADOL HCL 50 MG PO TABS: 50.0000 mg | ORAL_TABLET | Freq: Four times a day (QID) | ORAL | 0 refills | Status: DC | PRN

## 2017-05-15 MED ORDER — CYCLOBENZAPRINE HCL 10 MG PO TABS: 10.0000 mg | ORAL_TABLET | Freq: Three times a day (TID) | ORAL | 0 refills | Status: DC | PRN

## 2017-05-15 MED ORDER — IBUPROFEN 600 MG PO TABS: 600.0000 mg | ORAL_TABLET | Freq: Three times a day (TID) | ORAL | 0 refills | Status: AC | PRN

## 2017-05-15 MED ORDER — IBUPROFEN 600 MG PO TABS
600.0000 mg | ORAL_TABLET | Freq: Once | ORAL | Status: AC
  Administered 2017-05-15: 600 mg via ORAL
  Filled 2017-05-15: qty 1

## 2017-05-15 MED ORDER — CYCLOBENZAPRINE HCL 10 MG PO TABS
10.0000 mg | ORAL_TABLET | Freq: Once | ORAL | Status: AC
  Administered 2017-05-15: 10 mg via ORAL
  Filled 2017-05-15: qty 1
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
## 2017-05-21 DIAGNOSIS — R51 Headache: Secondary | ICD-10-CM | POA: Diagnosis not present

## 2017-07-11 DIAGNOSIS — S39012A Strain of muscle, fascia and tendon of lower back, initial encounter: Secondary | ICD-10-CM | POA: Diagnosis not present

## 2018-02-11 ENCOUNTER — Emergency Department
Admission: EM | Admit: 2018-02-11 | Discharge: 2018-02-11 | Disposition: A | Discharge status: Home / Self Care | Payer: Federal, State, Local not specified - PPO | Attending: Emergency Medicine | Admitting: Emergency Medicine

## 2018-02-11 ENCOUNTER — Encounter: Payer: Self-pay | Admitting: Emergency Medicine

## 2018-02-11 ENCOUNTER — Emergency Department: Payer: Federal, State, Local not specified - PPO

## 2018-02-11 ENCOUNTER — Other Ambulatory Visit: Payer: Self-pay

## 2018-02-11 DIAGNOSIS — J45909 Unspecified asthma, uncomplicated: Secondary | ICD-10-CM | POA: Diagnosis not present

## 2018-02-11 DIAGNOSIS — F319 Bipolar disorder, unspecified: Secondary | ICD-10-CM | POA: Insufficient documentation

## 2018-02-11 DIAGNOSIS — S0990XA Unspecified injury of head, initial encounter: Secondary | ICD-10-CM | POA: Diagnosis not present

## 2018-02-11 DIAGNOSIS — F1210 Cannabis abuse, uncomplicated: Secondary | ICD-10-CM | POA: Diagnosis not present

## 2018-02-11 DIAGNOSIS — W19XXXA Unspecified fall, initial encounter: Secondary | ICD-10-CM

## 2018-02-11 DIAGNOSIS — F419 Anxiety disorder, unspecified: Secondary | ICD-10-CM | POA: Insufficient documentation

## 2018-02-11 DIAGNOSIS — Y9389 Activity, other specified: Secondary | ICD-10-CM | POA: Insufficient documentation

## 2018-02-11 DIAGNOSIS — S069X9A Unspecified intracranial injury with loss of consciousness of unspecified duration, initial encounter: Secondary | ICD-10-CM | POA: Diagnosis not present

## 2018-02-11 DIAGNOSIS — F17210 Nicotine dependence, cigarettes, uncomplicated: Secondary | ICD-10-CM | POA: Diagnosis not present

## 2018-02-11 DIAGNOSIS — F1020 Alcohol dependence, uncomplicated: Secondary | ICD-10-CM | POA: Insufficient documentation

## 2018-02-11 DIAGNOSIS — W01198A Fall on same level from slipping, tripping and stumbling with subsequent striking against other object, initial encounter: Secondary | ICD-10-CM | POA: Insufficient documentation

## 2018-02-11 DIAGNOSIS — R51 Headache: Secondary | ICD-10-CM | POA: Diagnosis not present

## 2018-02-11 DIAGNOSIS — Y92018 Other place in single-family (private) house as the place of occurrence of the external cause: Secondary | ICD-10-CM | POA: Insufficient documentation

## 2018-02-11 DIAGNOSIS — Y998 Other external cause status: Secondary | ICD-10-CM | POA: Diagnosis not present

## 2018-02-11 LAB — POCT PREGNANCY, URINE: PREG TEST UR: NEGATIVE
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
## 2018-02-11 NOTE — ED Notes (Signed)
Blood and urine sent to lab

## 2018-02-11 NOTE — ED Notes (Signed)
Pt signed paper copy of discharge 

## 2018-02-11 NOTE — ED Notes (Signed)
Patient transported to CT 

## 2018-02-11 NOTE — Discharge Instructions (Addendum)

## 2018-02-11 NOTE — ED Notes (Addendum)
Pt is concerned because she "tasted metal" in her mouth and has a "swollen tongue" - pt is in NAD at this time - no swelling noted to tongue - respirations even/unlabored - pt is talking in complete sentences without difficulty - pt on phone with her mother refuses to acknowledge the nurses in the room Dr Don PerkingVeronese notified of pt question about what was going to be done for metal taste and swollen tongue - Dr Don PerkingVeronese stated that there was no further follow up to be completed and that the pt could drink milk for the metal taste Pt was notified and was talking to her mother on the phone very unhappy with plan of care - pt crying and cursing - this nurse asked pt to sign for discharge - she left room very angry and talking loudly to her mother [facility]

## 2021-11-08 ENCOUNTER — Emergency Department (HOSPITAL_COMMUNITY)
Admission: EM | Admit: 2021-11-08 | Discharge: 2021-11-08 | Disposition: A | Discharge status: Home / Self Care | Payer: Self-pay | Attending: Emergency Medicine | Admitting: Emergency Medicine

## 2021-11-08 ENCOUNTER — Emergency Department (HOSPITAL_COMMUNITY): Payer: Self-pay

## 2021-11-08 ENCOUNTER — Other Ambulatory Visit: Payer: Self-pay

## 2021-11-08 DIAGNOSIS — Y92410 Unspecified street and highway as the place of occurrence of the external cause: Secondary | ICD-10-CM | POA: Diagnosis not present

## 2021-11-08 DIAGNOSIS — S29001A Unspecified injury of muscle and tendon of front wall of thorax, initial encounter: Secondary | ICD-10-CM | POA: Diagnosis present

## 2021-11-08 DIAGNOSIS — M542 Cervicalgia: Secondary | ICD-10-CM | POA: Insufficient documentation

## 2021-11-08 DIAGNOSIS — R0789 Other chest pain: Secondary | ICD-10-CM

## 2021-11-08 DIAGNOSIS — J45909 Unspecified asthma, uncomplicated: Secondary | ICD-10-CM | POA: Diagnosis not present

## 2021-11-08 DIAGNOSIS — R519 Headache, unspecified: Secondary | ICD-10-CM | POA: Diagnosis not present

## 2021-11-08 DIAGNOSIS — S20312A Abrasion of left front wall of thorax, initial encounter: Secondary | ICD-10-CM | POA: Insufficient documentation

## 2021-11-08 MED ORDER — METHOCARBAMOL 500 MG PO TABS: 500.0000 mg | ORAL_TABLET | Freq: Two times a day (BID) | ORAL | 0 refills | Status: AC

## 2021-11-08 MED ORDER — IBUPROFEN 800 MG PO TABS
800.0000 mg | ORAL_TABLET | Freq: Once | ORAL | Status: AC
  Administered 2021-11-08: 800 mg via ORAL
  Filled 2021-11-08: qty 1
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
## 2021-11-16 ENCOUNTER — Ambulatory Visit
Admission: EM | Admit: 2021-11-16 | Discharge: 2021-11-16 | Disposition: A | Discharge status: Home / Self Care | Payer: Self-pay | Attending: Family Medicine | Admitting: Family Medicine

## 2021-11-16 ENCOUNTER — Encounter: Payer: Self-pay | Admitting: Emergency Medicine

## 2021-11-16 DIAGNOSIS — R519 Headache, unspecified: Secondary | ICD-10-CM

## 2021-11-16 DIAGNOSIS — S20211A Contusion of right front wall of thorax, initial encounter: Secondary | ICD-10-CM

## 2021-11-16 MED ORDER — NAPROXEN 500 MG PO TABS: 500.0000 mg | ORAL_TABLET | Freq: Two times a day (BID) | ORAL | 0 refills | Status: AC

## 2021-11-16 MED ORDER — KETOROLAC TROMETHAMINE 30 MG/ML IJ SOLN
30.0000 mg | Freq: Once | INTRAMUSCULAR | Status: AC
  Administered 2021-11-16: 30 mg via INTRAMUSCULAR

## 2021-11-16 NOTE — Discharge Instructions (Addendum)
You have been given a shot of Toradol 30 mg today ? ?Take naproxen 500 mg--1 tablet twice daily for pain ? ?You can try heating pad now on the sore areas on your chest. ?
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
## 2021-12-17 ENCOUNTER — Encounter (HOSPITAL_COMMUNITY): Payer: Self-pay | Admitting: Emergency Medicine

## 2021-12-17 ENCOUNTER — Other Ambulatory Visit: Payer: Self-pay

## 2021-12-17 ENCOUNTER — Emergency Department (HOSPITAL_COMMUNITY)
Admission: EM | Admit: 2021-12-17 | Discharge: 2021-12-17 | Disposition: A | Discharge status: Home / Self Care | Payer: Self-pay | Attending: Emergency Medicine | Admitting: Emergency Medicine

## 2021-12-17 DIAGNOSIS — N644 Mastodynia: Secondary | ICD-10-CM | POA: Insufficient documentation

## 2021-12-17 NOTE — Discharge Instructions (Addendum)
Please follow-up at the breast center for a breast ultrasound for further evaluation of your breast tenderness. ?
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
## 2021-12-17 NOTE — ED Notes (Signed)
An After Visit Summary was printed and given to the patient. Discharge instructions given and no further questions at this time.  

## 2021-12-19 ENCOUNTER — Other Ambulatory Visit: Payer: Self-pay

## 2021-12-19 ENCOUNTER — Other Ambulatory Visit (HOSPITAL_COMMUNITY): Payer: Self-pay | Admitting: Emergency Medicine

## 2021-12-19 DIAGNOSIS — N644 Mastodynia: Secondary | ICD-10-CM

## 2021-12-19 DIAGNOSIS — Q839 Congenital malformation of breast, unspecified: Secondary | ICD-10-CM

## 2022-01-05 ENCOUNTER — Other Ambulatory Visit: Payer: Self-pay | Admitting: Obstetrics and Gynecology

## 2022-01-05 ENCOUNTER — Ambulatory Visit
Admission: RE | Admit: 2022-01-05 | Discharge: 2022-01-05 | Disposition: A | Discharge status: Home / Self Care | Payer: Self-pay | Source: Ambulatory Visit | Attending: Obstetrics and Gynecology | Admitting: Obstetrics and Gynecology

## 2022-01-05 ENCOUNTER — Ambulatory Visit: Payer: Self-pay | Admitting: *Deleted

## 2022-01-05 ENCOUNTER — Ambulatory Visit
Admission: RE | Admit: 2022-01-05 | Discharge: 2022-01-05 | Disposition: A | Discharge status: Home / Self Care | Payer: No Typology Code available for payment source | Source: Ambulatory Visit | Attending: Obstetrics and Gynecology | Admitting: Obstetrics and Gynecology

## 2022-01-05 VITALS — BP 152/98 | Wt 221.5 lb

## 2022-01-05 DIAGNOSIS — N644 Mastodynia: Secondary | ICD-10-CM

## 2022-01-05 DIAGNOSIS — Q839 Congenital malformation of breast, unspecified: Secondary | ICD-10-CM

## 2022-01-05 DIAGNOSIS — N641 Fat necrosis of breast: Secondary | ICD-10-CM

## 2022-01-05 DIAGNOSIS — Z01419 Encounter for gynecological examination (general) (routine) without abnormal findings: Secondary | ICD-10-CM

## 2022-01-05 NOTE — Patient Instructions (Signed)
Explained breast self awareness with [name]. Pap smear completed today. Let her know [facility] will cover Pap smears and HPV typing every 5 years unless has a history of abnormal Pap smears. Referred patient to the [facility] for a diagnostic mammogram. Appointment scheduled Thursday, Jan 05, 2022 at 1310. Patient aware of appointment and will be there. Let patient know will follow up with her within the next week with results of wet prep and Pap smear by phone. [name] verbalized understanding.  Rondy Krupinski, Kathaleen Maser, RN 12:09 PM
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
## 2022-01-06 LAB — CYTOLOGY - PAP
Comment: NEGATIVE
Diagnosis: NEGATIVE
High risk HPV: NEGATIVE

## 2022-01-10 ENCOUNTER — Telehealth: Payer: Self-pay

## 2022-01-10 LAB — CERVICOVAGINAL ANCILLARY ONLY
Bacterial Vaginitis (gardnerella): POSITIVE — AB
Candida Glabrata: NEGATIVE
Candida Vaginitis: POSITIVE — AB
Comment: NEGATIVE
Comment: NEGATIVE
Comment: NEGATIVE
Comment: NEGATIVE
Trichomonas: POSITIVE — AB

## 2022-01-10 MED ORDER — METRONIDAZOLE 500 MG PO TABS: 500.0000 mg | ORAL_TABLET | Freq: Two times a day (BID) | ORAL | 0 refills | Status: AC

## 2022-01-10 NOTE — Telephone Encounter (Signed)
Patient informed negative Pap/HPV, next pap due in 5 years, positive for trichomonas. Discussed trichomonas and treatment. Patient verbalized understanding. Rx sent to [facility].

## 2022-01-11 ENCOUNTER — Telehealth: Payer: Self-pay

## 2022-01-11 NOTE — Telephone Encounter (Signed)
Attempted to contact patient regarding lab results. Left message on voicemail requesting a return call.  

## 2022-01-13 ENCOUNTER — Other Ambulatory Visit: Payer: Self-pay

## 2022-01-13 ENCOUNTER — Telehealth: Payer: Self-pay

## 2022-01-13 MED ORDER — FLUCONAZOLE 150 MG PO TABS: 150.0000 mg | ORAL_TABLET | Freq: Once | ORAL | 0 refills | Status: AC

## 2022-01-13 NOTE — Telephone Encounter (Signed)
Patient informed wet prep results, positive for bacterial vaginitis, yeast infection, and trichomonas, previous rx for trichomonas will also treat BV, will need rx for Diflucan tablet. Patient informed to take 1 tablet now, then if symptoms persist, or notices increase after completion of metronidazole, may repeat in 1 week. Patient verbalized understanding.

## 2022-03-03 ENCOUNTER — Ambulatory Visit
Admission: RE | Admit: 2022-03-03 | Discharge: 2022-03-03 | Disposition: A | Discharge status: Home / Self Care | Payer: No Typology Code available for payment source | Source: Ambulatory Visit | Attending: Obstetrics and Gynecology | Admitting: Obstetrics and Gynecology

## 2022-03-03 DIAGNOSIS — N641 Fat necrosis of breast: Secondary | ICD-10-CM

## 2022-07-14 DIAGNOSIS — Z419 Encounter for procedure for purposes other than remedying health state, unspecified: Secondary | ICD-10-CM | POA: Diagnosis not present

## 2022-08-14 DIAGNOSIS — Z419 Encounter for procedure for purposes other than remedying health state, unspecified: Secondary | ICD-10-CM | POA: Diagnosis not present

## 2022-09-14 DIAGNOSIS — Z419 Encounter for procedure for purposes other than remedying health state, unspecified: Secondary | ICD-10-CM | POA: Diagnosis not present

## 2022-10-13 DIAGNOSIS — Z419 Encounter for procedure for purposes other than remedying health state, unspecified: Secondary | ICD-10-CM | POA: Diagnosis not present

## 2022-11-13 DIAGNOSIS — Z419 Encounter for procedure for purposes other than remedying health state, unspecified: Secondary | ICD-10-CM | POA: Diagnosis not present

## 2022-12-13 DIAGNOSIS — Z419 Encounter for procedure for purposes other than remedying health state, unspecified: Secondary | ICD-10-CM | POA: Diagnosis not present

## 2023-01-13 DIAGNOSIS — Z419 Encounter for procedure for purposes other than remedying health state, unspecified: Secondary | ICD-10-CM | POA: Diagnosis not present

## 2023-02-12 DIAGNOSIS — Z419 Encounter for procedure for purposes other than remedying health state, unspecified: Secondary | ICD-10-CM | POA: Diagnosis not present

## 2023-03-15 DIAGNOSIS — Z419 Encounter for procedure for purposes other than remedying health state, unspecified: Secondary | ICD-10-CM | POA: Diagnosis not present

## 2023-03-16 IMAGING — US US BREAST*R* LIMITED INC AXILLA
1 series · 7 of 7 positions shown · non-contrast
Comparison: Previous exam(s).

CLINICAL DATA: Patient presents for deformity of the right breast.
breast.

EXAM:
DIGITAL DIAGNOSTIC BILATERAL MAMMOGRAM WITH TOMOSYNTHESIS AND CAD;
ULTRASOUND RIGHT BREAST LIMITED
TECHNIQUE: Bilateral digital diagnostic mammography and breast tomosynthesis
was performed. The images were evaluated with computer-aided
detection.; Targeted ultrasound examination of the right breast was
performed

[Series 1: us breast*right* limited inc axilla · 0.09mm/px · 7 acquisitions, 7 frames shown]
[im 1/7]
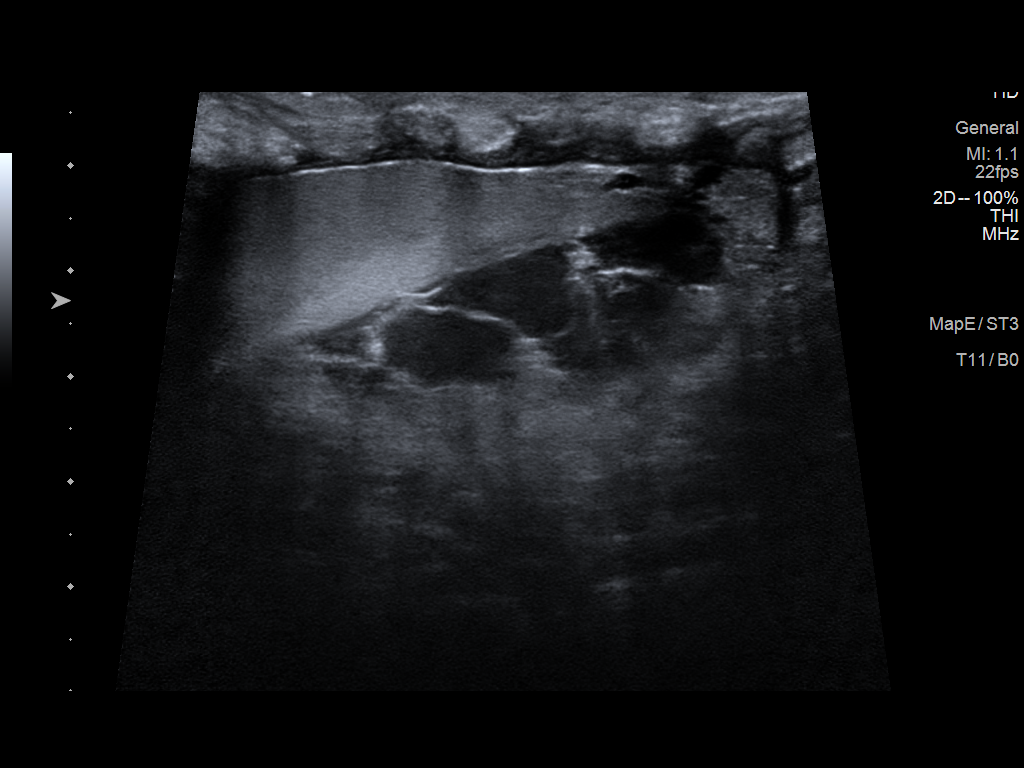
[im 2/7]
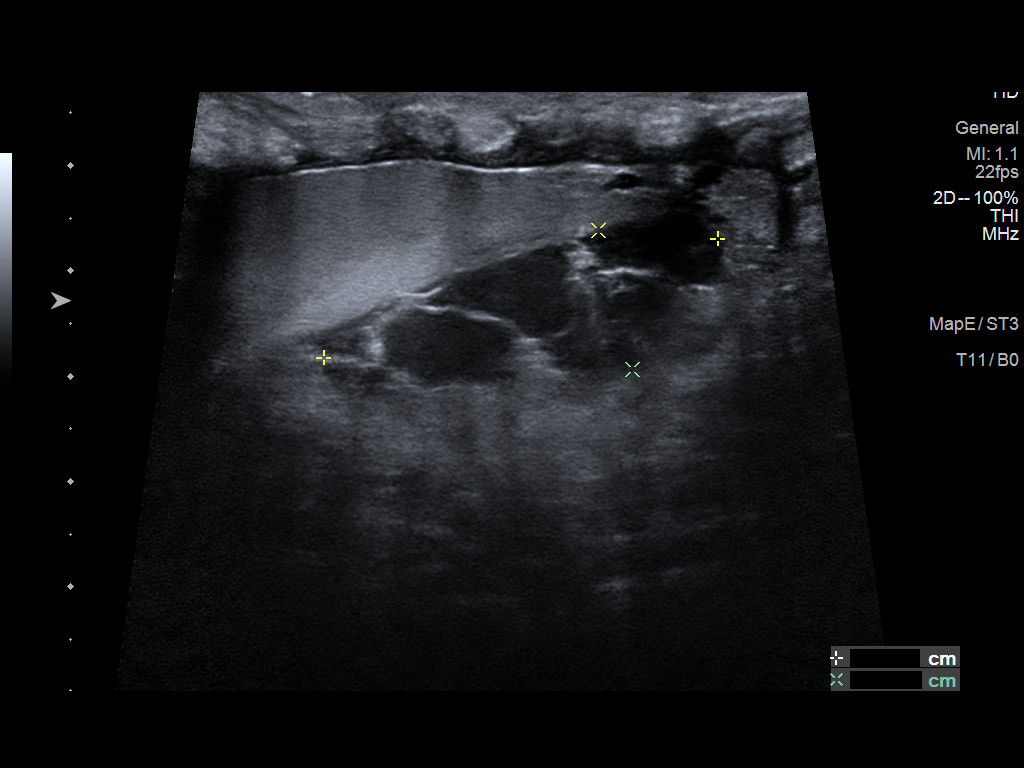
[im 3/7]
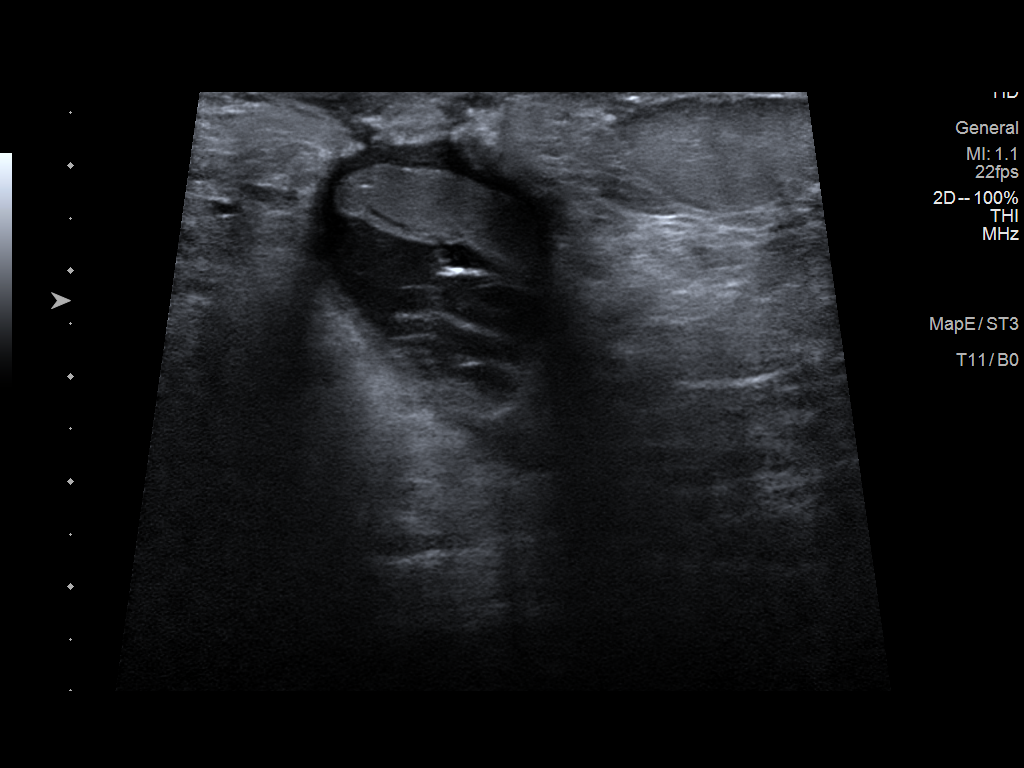
[im 4/7]
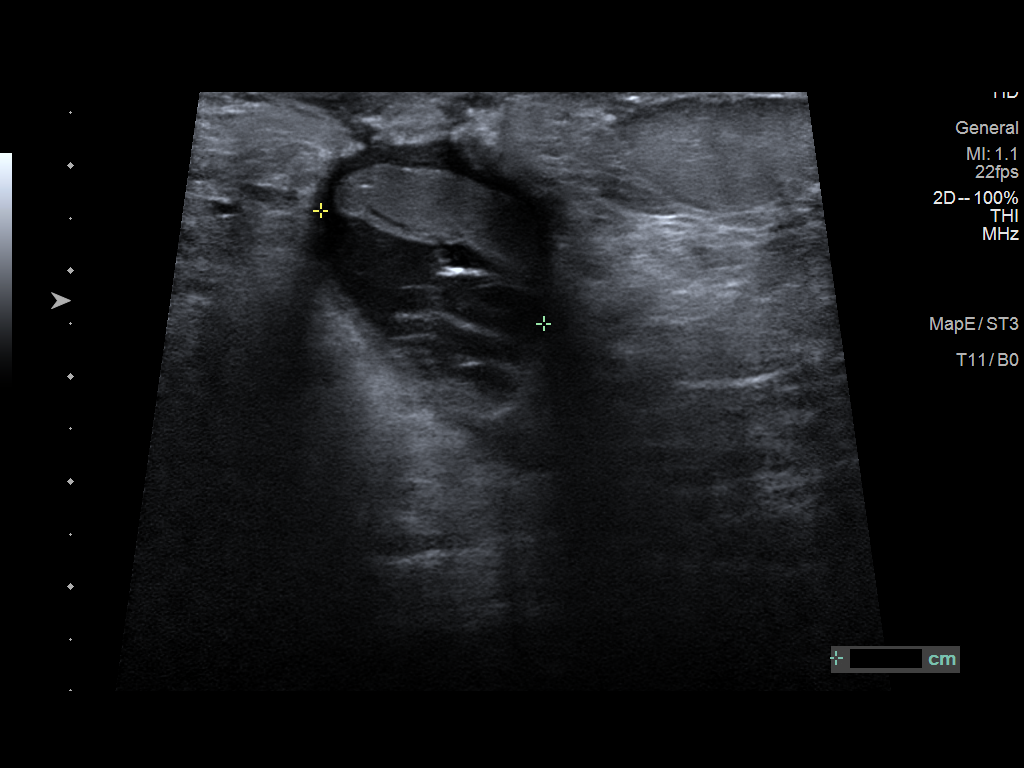
[im 5/7]
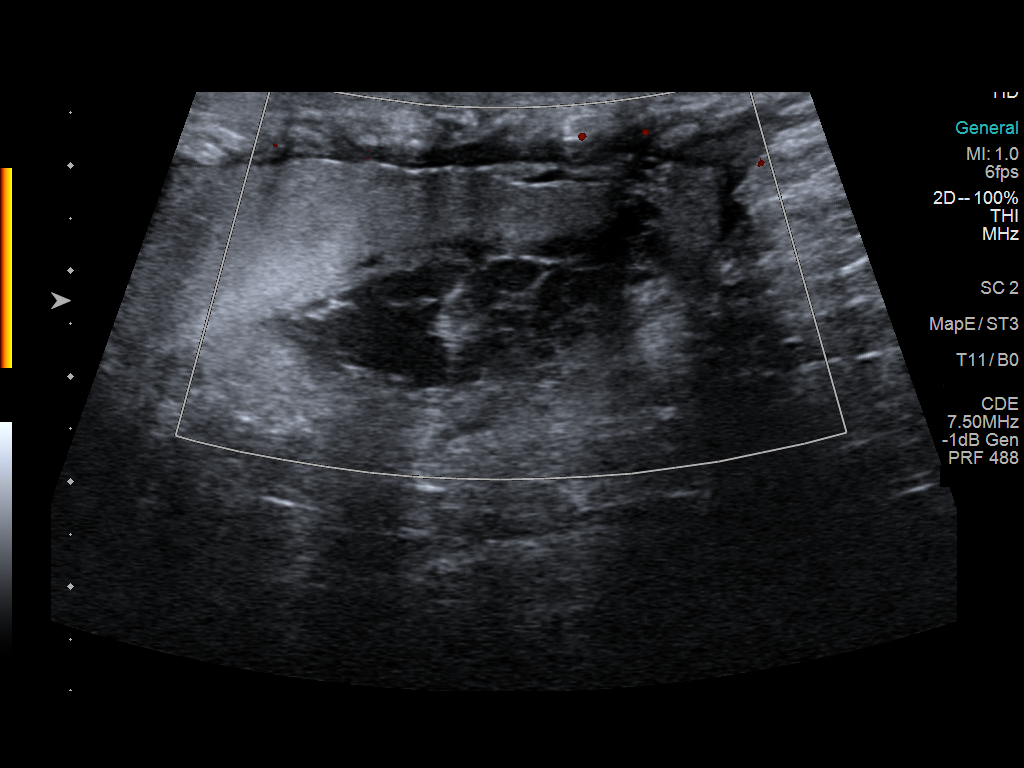
[im 6/7]
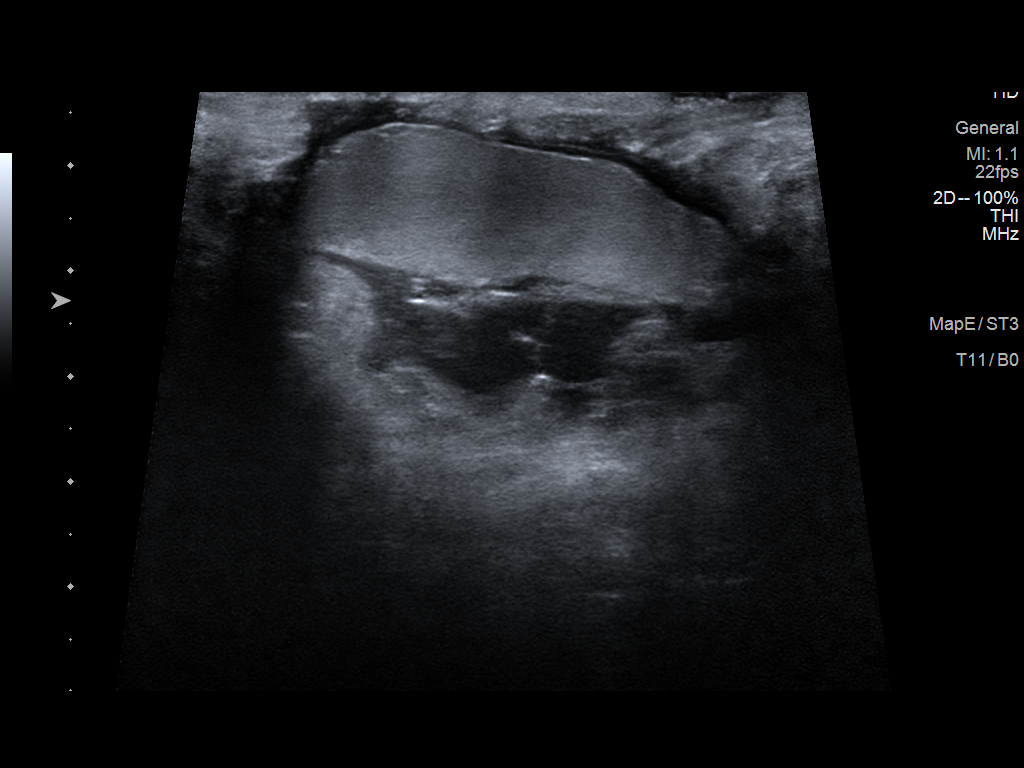
[im 7/7]
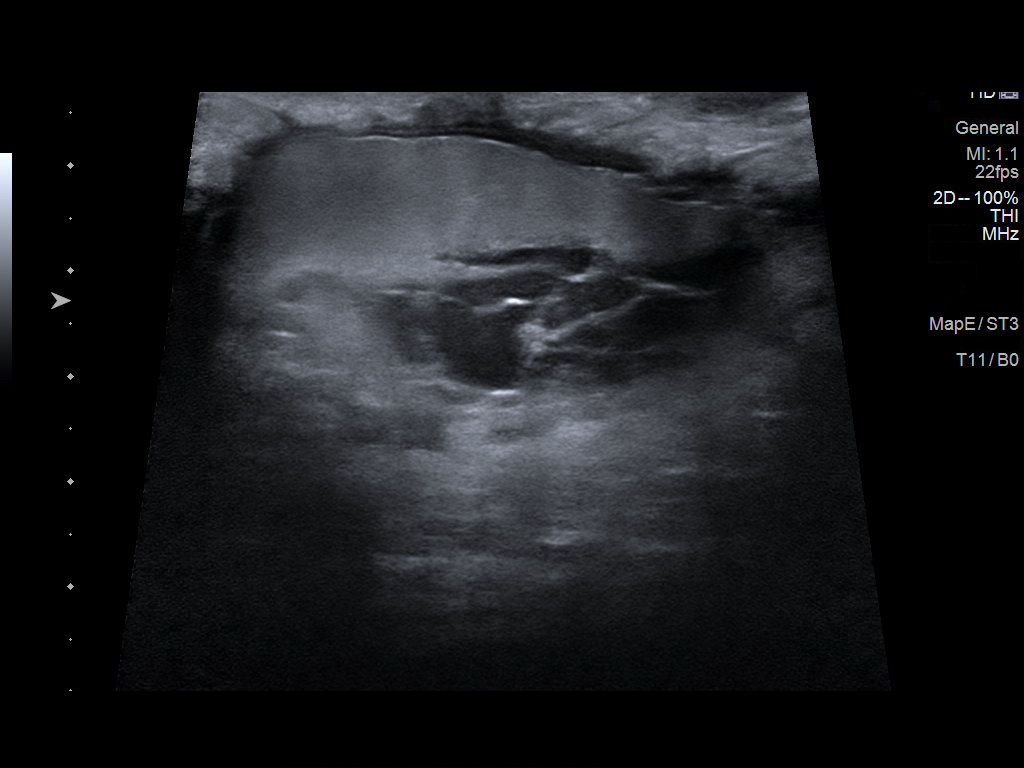

[7 of 7 positions shown; findings below may reference images not displayed]

ACR Breast Density Category b: There are scattered areas of
fibroglandular density.
FINDINGS: Within the central aspect of the right breast there is a large
masslike asymmetry with associated interspersed fatty density. No
additional masses, calcifications or distortion identified within
either breast.

On physical exam, the right breast is sunken in in the lateral
periareolar location.

Targeted ultrasound is performed, showing a large mixed echogenicity
mass within the central right breast 9 o'clock position 1 cm from
the nipple measuring 4.0 x 1.4 x 2.4 cm. There is additional
echogenic material extending posterior to this mass deeper within
the right breast.
IMPRESSION: Findings within the central right breast are likely posttraumatic
fat necrosis and beginning interval healing.

RECOMMENDATION:
Right breast diagnostic mammogram and ultrasound in 8 weeks to
reassess suspected large area of fat necrosis within the central
right breast.

I have discussed the findings and recommendations with the patient.
If applicable, a reminder letter will be sent to the patient
regarding the next appointment.

BI-RADS CATEGORY  3: Probably benign.

## 2023-03-16 IMAGING — MG DIGITAL DIAGNOSTIC BILAT W/ TOMO W/ CAD
8 of 14 series · 8 of 40 positions shown · non-contrast
Comparison: Previous exam(s).

CLINICAL DATA: Patient presents for deformity of the right breast.
breast.

EXAM:
DIGITAL DIAGNOSTIC BILATERAL MAMMOGRAM WITH TOMOSYNTHESIS AND CAD;
ULTRASOUND RIGHT BREAST LIMITED
TECHNIQUE: Bilateral digital diagnostic mammography and breast tomosynthesis
was performed. The images were evaluated with computer-aided
detection.; Targeted ultrasound examination of the right breast was
performed

[R CC synth-2D (1 of 2)]
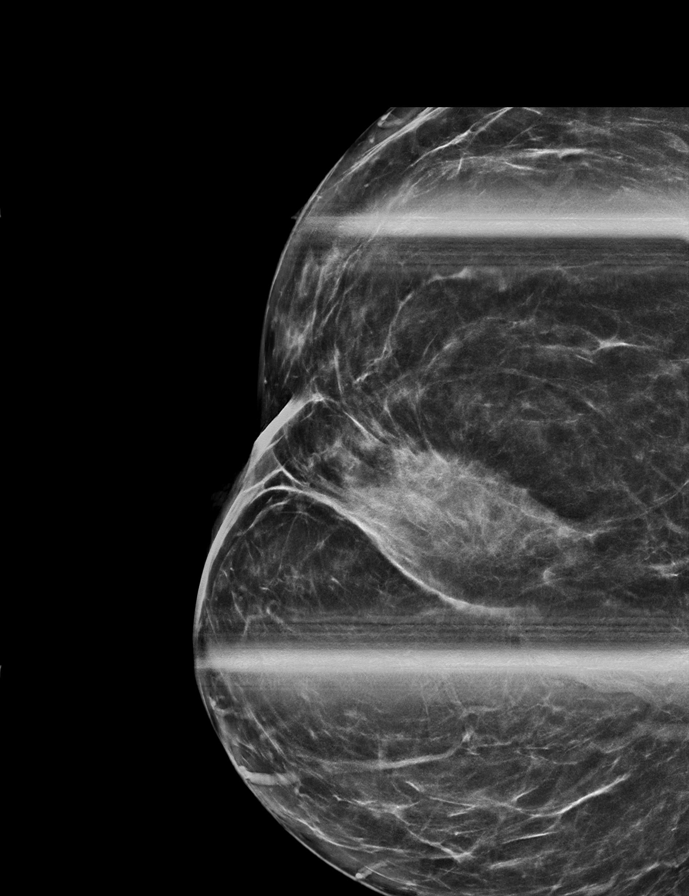

[R CC synth-2D (2 of 2)]
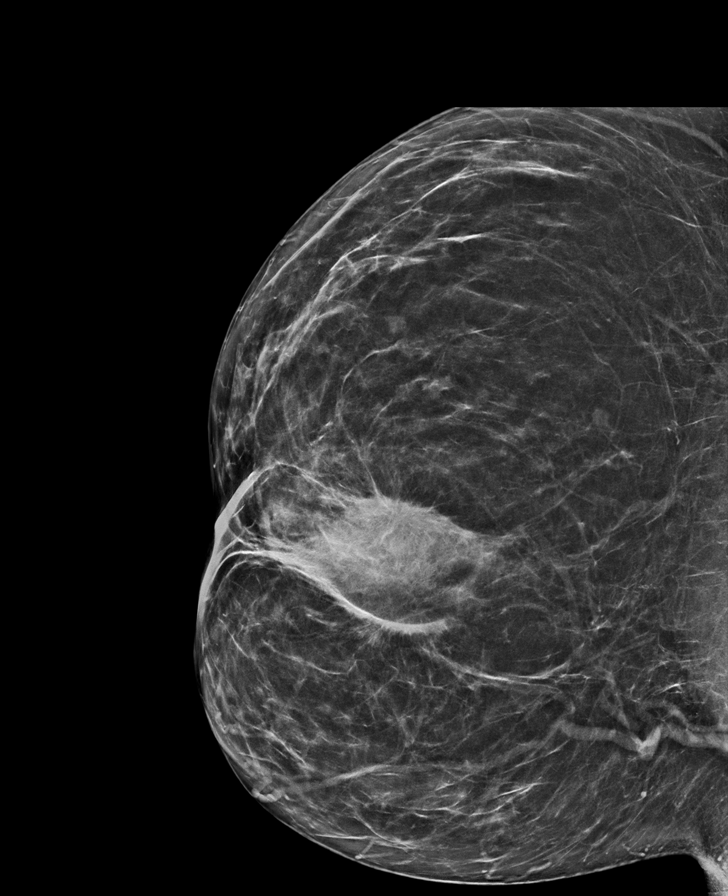

[R MLO synth-2D (1 of 2)]
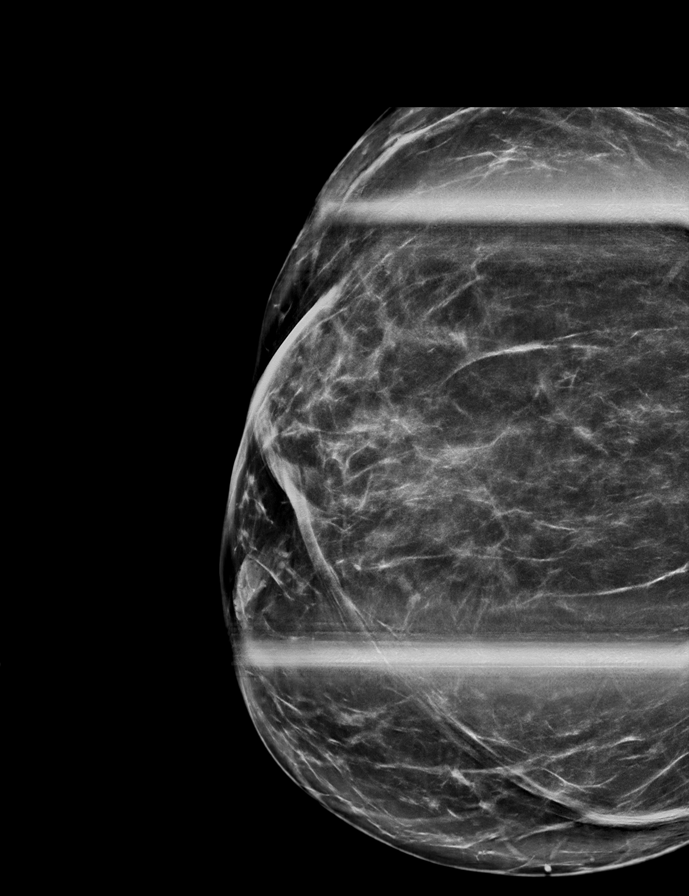

[L MLO synth-2D]
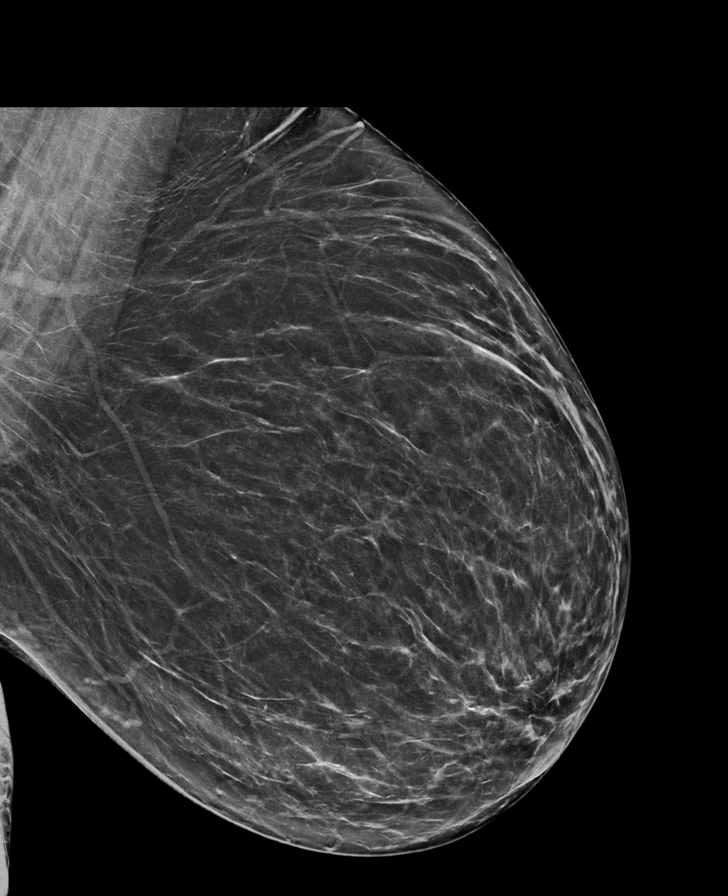

[R MLO synth-2D (2 of 2)]
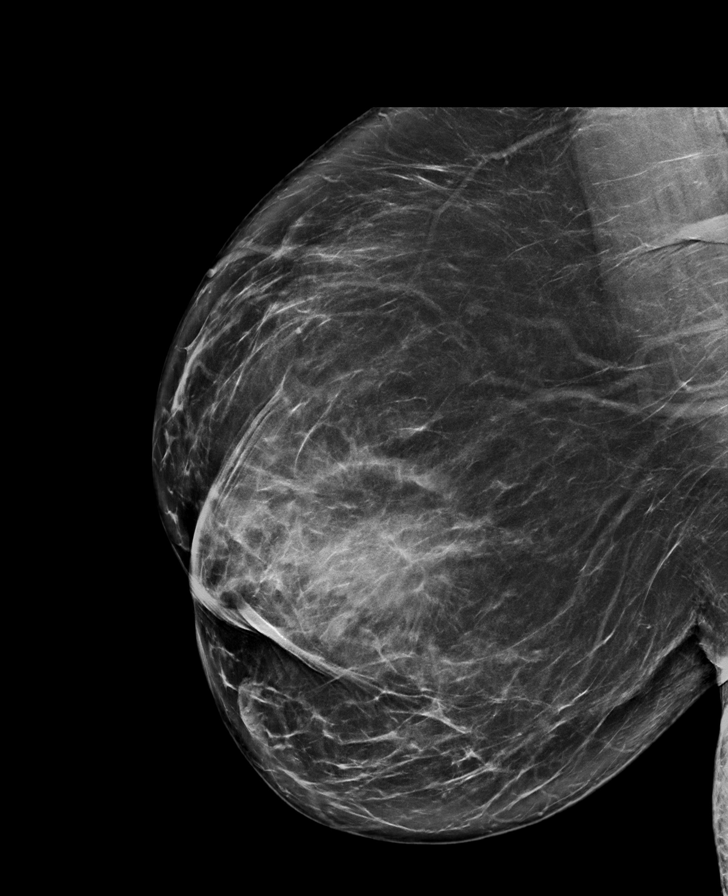

[R ML synth-2D]
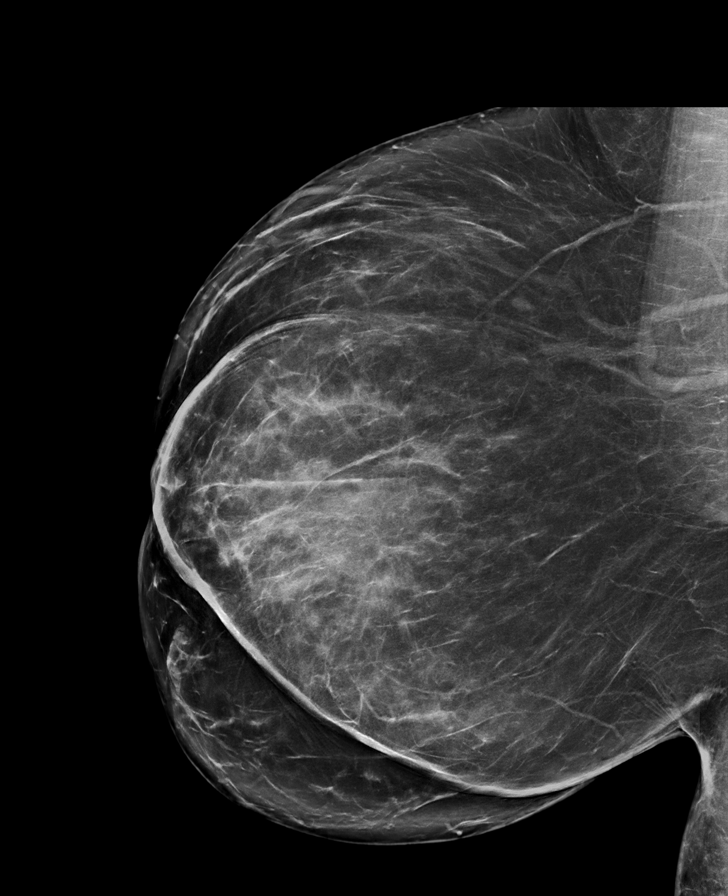

[L CC synth-2D]
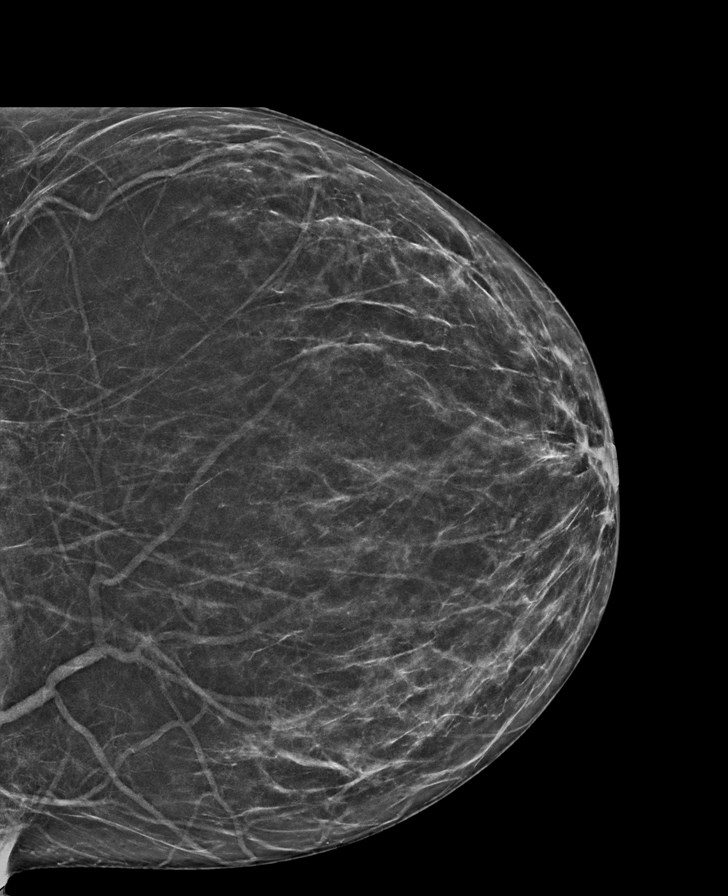

[R MLO tomo · tomo slice 47/94.0]
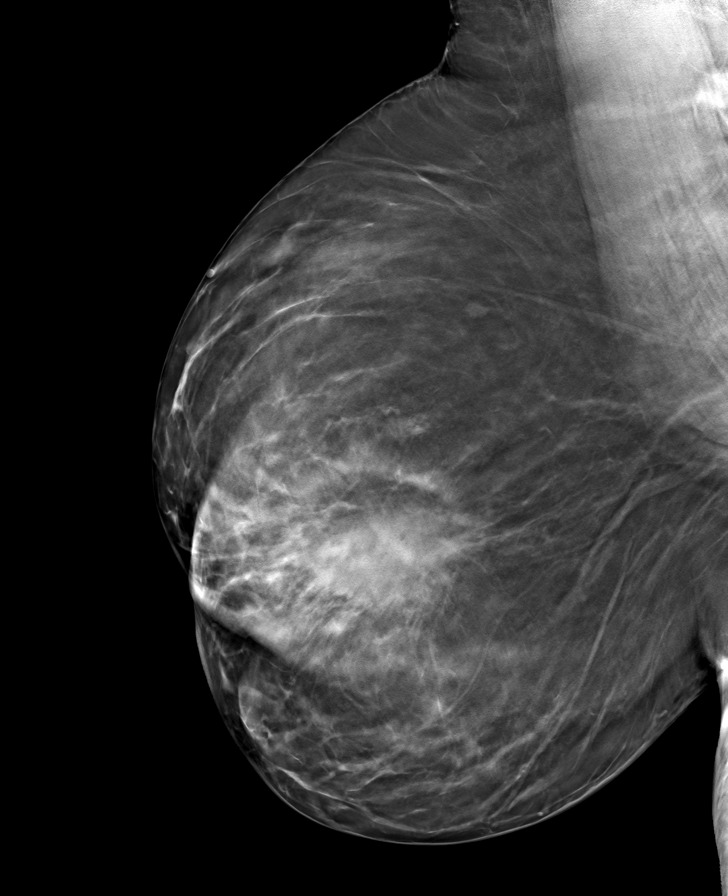

[8 of 40 positions shown; findings below may reference images not displayed]

ACR Breast Density Category b: There are scattered areas of
fibroglandular density.
FINDINGS: Within the central aspect of the right breast there is a large
masslike asymmetry with associated interspersed fatty density. No
additional masses, calcifications or distortion identified within
either breast.

On physical exam, the right breast is sunken in in the lateral
periareolar location.

Targeted ultrasound is performed, showing a large mixed echogenicity
mass within the central right breast 9 o'clock position 1 cm from
the nipple measuring 4.0 x 1.4 x 2.4 cm. There is additional
echogenic material extending posterior to this mass deeper within
the right breast.
IMPRESSION: Findings within the central right breast are likely posttraumatic
fat necrosis and beginning interval healing.

RECOMMENDATION:
Right breast diagnostic mammogram and ultrasound in 8 weeks to
reassess suspected large area of fat necrosis within the central
right breast.

I have discussed the findings and recommendations with the patient.
If applicable, a reminder letter will be sent to the patient
regarding the next appointment.

BI-RADS CATEGORY  3: Probably benign.

## 2023-04-15 DIAGNOSIS — Z419 Encounter for procedure for purposes other than remedying health state, unspecified: Secondary | ICD-10-CM | POA: Diagnosis not present

## 2023-05-15 DIAGNOSIS — Z419 Encounter for procedure for purposes other than remedying health state, unspecified: Secondary | ICD-10-CM | POA: Diagnosis not present

## 2023-06-15 DIAGNOSIS — Z419 Encounter for procedure for purposes other than remedying health state, unspecified: Secondary | ICD-10-CM | POA: Diagnosis not present

## 2023-07-15 DIAGNOSIS — Z419 Encounter for procedure for purposes other than remedying health state, unspecified: Secondary | ICD-10-CM | POA: Diagnosis not present

## 2023-08-15 DIAGNOSIS — Z419 Encounter for procedure for purposes other than remedying health state, unspecified: Secondary | ICD-10-CM | POA: Diagnosis not present

## 2023-09-15 DIAGNOSIS — Z419 Encounter for procedure for purposes other than remedying health state, unspecified: Secondary | ICD-10-CM | POA: Diagnosis not present

## 2023-10-13 DIAGNOSIS — Z419 Encounter for procedure for purposes other than remedying health state, unspecified: Secondary | ICD-10-CM | POA: Diagnosis not present

## 2023-11-24 DIAGNOSIS — Z419 Encounter for procedure for purposes other than remedying health state, unspecified: Secondary | ICD-10-CM | POA: Diagnosis not present

## 2023-12-24 DIAGNOSIS — Z419 Encounter for procedure for purposes other than remedying health state, unspecified: Secondary | ICD-10-CM | POA: Diagnosis not present

## 2024-01-24 DIAGNOSIS — Z419 Encounter for procedure for purposes other than remedying health state, unspecified: Secondary | ICD-10-CM | POA: Diagnosis not present

## 2024-02-23 DIAGNOSIS — Z419 Encounter for procedure for purposes other than remedying health state, unspecified: Secondary | ICD-10-CM | POA: Diagnosis not present

## 2024-03-25 DIAGNOSIS — Z419 Encounter for procedure for purposes other than remedying health state, unspecified: Secondary | ICD-10-CM | POA: Diagnosis not present

## 2024-04-25 DIAGNOSIS — Z419 Encounter for procedure for purposes other than remedying health state, unspecified: Secondary | ICD-10-CM | POA: Diagnosis not present

## 2024-05-25 DIAGNOSIS — Z419 Encounter for procedure for purposes other than remedying health state, unspecified: Secondary | ICD-10-CM | POA: Diagnosis not present

## 2024-06-25 DIAGNOSIS — Z419 Encounter for procedure for purposes other than remedying health state, unspecified: Secondary | ICD-10-CM | POA: Diagnosis not present
